# Patient Record
Sex: Female | Born: 1935 | Hispanic: No | Smoking: Former smoker
Health system: Southern US, Community
[De-identification: ages and names within clinical notes are randomized; demographics above are authoritative.]

## PROBLEM LIST (undated history)

## (undated) DIAGNOSIS — S82102A Unspecified fracture of upper end of left tibia, initial encounter for closed fracture: Secondary | ICD-10-CM

## (undated) DIAGNOSIS — F0390 Unspecified dementia without behavioral disturbance: Secondary | ICD-10-CM

## (undated) DIAGNOSIS — G20 Parkinson's disease: Secondary | ICD-10-CM

## (undated) DIAGNOSIS — F419 Anxiety disorder, unspecified: Secondary | ICD-10-CM

## (undated) DIAGNOSIS — M1990 Unspecified osteoarthritis, unspecified site: Secondary | ICD-10-CM

## (undated) DIAGNOSIS — G20A1 Parkinson's disease without dyskinesia, without mention of fluctuations: Secondary | ICD-10-CM

## (undated) DIAGNOSIS — F329 Major depressive disorder, single episode, unspecified: Secondary | ICD-10-CM

## (undated) DIAGNOSIS — F32A Depression, unspecified: Secondary | ICD-10-CM

## (undated) HISTORY — DX: Unspecified osteoarthritis, unspecified site: M19.90

## (undated) HISTORY — DX: Parkinson's disease: G20

## (undated) HISTORY — PX: TONSILLECTOMY: SUR1361

## (undated) HISTORY — DX: Depression, unspecified: F32.A

## (undated) HISTORY — DX: Major depressive disorder, single episode, unspecified: F32.9

## (undated) HISTORY — DX: Parkinson's disease without dyskinesia, without mention of fluctuations: G20.A1

## (undated) HISTORY — PX: APPENDECTOMY: SHX54

## (undated) HISTORY — PX: COLONOSCOPY W/ POLYPECTOMY: SHX1380

## (undated) HISTORY — DX: Anxiety disorder, unspecified: F41.9

## (undated) HISTORY — DX: Unspecified dementia, unspecified severity, without behavioral disturbance, psychotic disturbance, mood disturbance, and anxiety: F03.90

---

## 2003-03-31 ENCOUNTER — Encounter: Payer: Self-pay | Admitting: Internal Medicine

## 2006-04-01 ENCOUNTER — Encounter: Payer: Self-pay | Admitting: Internal Medicine

## 2006-10-26 ENCOUNTER — Ambulatory Visit: Payer: Self-pay | Admitting: Internal Medicine

## 2006-10-26 DIAGNOSIS — M810 Age-related osteoporosis without current pathological fracture: Secondary | ICD-10-CM

## 2006-10-26 DIAGNOSIS — M1990 Unspecified osteoarthritis, unspecified site: Secondary | ICD-10-CM | POA: Insufficient documentation

## 2006-10-26 DIAGNOSIS — F411 Generalized anxiety disorder: Secondary | ICD-10-CM | POA: Insufficient documentation

## 2006-10-26 DIAGNOSIS — R32 Unspecified urinary incontinence: Secondary | ICD-10-CM

## 2006-10-26 DIAGNOSIS — F329 Major depressive disorder, single episode, unspecified: Secondary | ICD-10-CM

## 2006-10-26 DIAGNOSIS — F068 Other specified mental disorders due to known physiological condition: Secondary | ICD-10-CM

## 2006-10-26 DIAGNOSIS — G20 Parkinson's disease: Secondary | ICD-10-CM

## 2007-01-01 ENCOUNTER — Ambulatory Visit: Payer: Self-pay | Admitting: *Deleted

## 2007-01-06 ENCOUNTER — Telehealth (INDEPENDENT_AMBULATORY_CARE_PROVIDER_SITE_OTHER): Payer: Self-pay | Admitting: *Deleted

## 2007-01-08 ENCOUNTER — Encounter (INDEPENDENT_AMBULATORY_CARE_PROVIDER_SITE_OTHER): Payer: Self-pay | Admitting: *Deleted

## 2007-01-26 ENCOUNTER — Encounter (INDEPENDENT_AMBULATORY_CARE_PROVIDER_SITE_OTHER): Payer: Self-pay | Admitting: *Deleted

## 2007-02-01 ENCOUNTER — Ambulatory Visit: Payer: Self-pay | Admitting: *Deleted

## 2007-02-24 ENCOUNTER — Ambulatory Visit: Payer: Self-pay | Admitting: *Deleted

## 2007-02-26 ENCOUNTER — Ambulatory Visit: Payer: Self-pay | Admitting: *Deleted

## 2007-05-10 ENCOUNTER — Ambulatory Visit: Payer: Self-pay | Admitting: *Deleted

## 2007-06-03 ENCOUNTER — Encounter: Payer: Self-pay | Admitting: Internal Medicine

## 2007-07-15 ENCOUNTER — Encounter: Payer: Self-pay | Admitting: Internal Medicine

## 2007-07-15 ENCOUNTER — Ambulatory Visit: Payer: Self-pay | Admitting: Internal Medicine

## 2007-09-09 ENCOUNTER — Ambulatory Visit: Payer: Self-pay | Admitting: Internal Medicine

## 2007-09-09 ENCOUNTER — Encounter: Payer: Self-pay | Admitting: Internal Medicine

## 2007-11-04 ENCOUNTER — Encounter: Payer: Self-pay | Admitting: Internal Medicine

## 2007-11-04 ENCOUNTER — Ambulatory Visit: Payer: Self-pay | Admitting: Internal Medicine

## 2008-02-22 ENCOUNTER — Ambulatory Visit: Payer: Self-pay | Admitting: Internal Medicine

## 2008-02-22 ENCOUNTER — Encounter: Payer: Self-pay | Admitting: Internal Medicine

## 2008-06-08 ENCOUNTER — Encounter: Payer: Self-pay | Admitting: Internal Medicine

## 2008-06-08 ENCOUNTER — Ambulatory Visit: Payer: Self-pay | Admitting: Internal Medicine

## 2008-06-26 ENCOUNTER — Encounter: Payer: Self-pay | Admitting: Internal Medicine

## 2008-09-07 ENCOUNTER — Encounter: Payer: Self-pay | Admitting: Internal Medicine

## 2008-09-07 ENCOUNTER — Ambulatory Visit: Payer: Self-pay | Admitting: Internal Medicine

## 2008-11-16 ENCOUNTER — Ambulatory Visit: Payer: Self-pay | Admitting: Internal Medicine

## 2008-11-16 ENCOUNTER — Encounter: Payer: Self-pay | Admitting: Internal Medicine

## 2008-11-16 DIAGNOSIS — J069 Acute upper respiratory infection, unspecified: Secondary | ICD-10-CM | POA: Insufficient documentation

## 2009-04-05 ENCOUNTER — Encounter: Payer: Self-pay | Admitting: Internal Medicine

## 2009-04-05 ENCOUNTER — Ambulatory Visit: Payer: Self-pay | Admitting: Internal Medicine

## 2009-04-09 ENCOUNTER — Telehealth: Payer: Self-pay | Admitting: Internal Medicine

## 2009-04-10 ENCOUNTER — Telehealth: Payer: Self-pay | Admitting: Internal Medicine

## 2009-04-11 ENCOUNTER — Encounter: Payer: Self-pay | Admitting: Internal Medicine

## 2009-05-23 ENCOUNTER — Telehealth: Payer: Self-pay | Admitting: Internal Medicine

## 2009-07-19 ENCOUNTER — Encounter: Payer: Self-pay | Admitting: Internal Medicine

## 2009-07-19 ENCOUNTER — Ambulatory Visit: Payer: Self-pay | Admitting: Internal Medicine

## 2009-08-01 ENCOUNTER — Telehealth: Payer: Self-pay | Admitting: Family Medicine

## 2009-08-06 ENCOUNTER — Telehealth: Payer: Self-pay | Admitting: Internal Medicine

## 2009-10-31 ENCOUNTER — Ambulatory Visit: Payer: Self-pay | Admitting: Family Medicine

## 2009-10-31 ENCOUNTER — Encounter: Payer: Self-pay | Admitting: Family Medicine

## 2009-10-31 DIAGNOSIS — H6120 Impacted cerumen, unspecified ear: Secondary | ICD-10-CM | POA: Insufficient documentation

## 2009-12-04 ENCOUNTER — Telehealth: Payer: Self-pay | Admitting: Internal Medicine

## 2009-12-31 ENCOUNTER — Telehealth: Payer: Self-pay | Admitting: Internal Medicine

## 2010-02-14 ENCOUNTER — Encounter: Payer: Self-pay | Admitting: Internal Medicine

## 2010-02-14 DIAGNOSIS — M1990 Unspecified osteoarthritis, unspecified site: Secondary | ICD-10-CM

## 2010-02-14 DIAGNOSIS — F329 Major depressive disorder, single episode, unspecified: Secondary | ICD-10-CM

## 2010-02-14 DIAGNOSIS — G20 Parkinson's disease: Secondary | ICD-10-CM

## 2010-02-14 DIAGNOSIS — F068 Other specified mental disorders due to known physiological condition: Secondary | ICD-10-CM

## 2010-02-19 NOTE — Miscellaneous (Signed)
Summary: Patient Care Update/[facility]  Patient Care Update/[facility]   Imported By: Maryln Gottron 04/17/2009 09:53:35  _____________________________________________________________________  External Attachment:    Type:   Image     Comment:   External Document

## 2010-02-19 NOTE — Assessment & Plan Note (Signed)
Summary: [facility]   Vital Signs:  Patient profile:   77 year old female Weight:      159 pounds Temp:     97.4 degrees F Pulse rate:   78 / minute Resp:     18 per minute BP sitting:   124 / 70 CC: Assisted living follow up   History of Present Illness: Reviewed status with Albin Felling RN and Bonita Quin -patient care coordinator doing fine  She notes mild backache in past few days no fall or apparent injury doing okay without meds so far  No changes in Parkinsons Remains completely independent with ADLs Walks stably  with rollator---can do without it in apt  No real change in memory problems she doesn't remember if she had any response to aricept when it was first started  Goes down for meals Stays to herself for the most part  Not depressed Enjoys reading Occ watches TV--some trouble with all the channels on cable now  Allergies: 1)  ! Codeine  Past History:  Past medical, surgical, family and social histories (including risk factors) reviewed for relevance to current acute and chronic problems.  Past Medical History: Reviewed history from 10/26/2006 and no changes required. Dementia Parkinson's disease Anxiety Depression Osteoarthritis Osteoporosis Urinary incontinence  Past Surgical History: Reviewed history from 10/26/2006 and no changes required. Appendectomy Tonsillectomy Colonoscopy with polyps--25 years ago or more  Family History: Reviewed history from 10/26/2006 and no changes required. Dad died of heart condition @70 's Mom died of old age in 75's 6 sisters all lived into 68's or thereabout No HTN or DM No breast or colon cancer  Social History: Reviewed history from 09/09/2007 and no changes required. Moved from [place]--was in assisted living in [facility] then Divorced (now widowed0 3 children--oldest son Jonny Ruiz lives locally Homemaker Former Smoker--very brief, few Alcohol use--none in many years Loves reading  Has an advanced  directive Requests DNR Would accept hospitalization Would not want G-tube Requests son Jonny Ruiz to be health care POA  Review of Systems       appetite is fine weight is up 8#---will watch sleeps well  Physical Exam  General:  alert and normal appearance.   Neck:  supple, no masses, no thyromegaly, and no cervical lymphadenopathy.   Lungs:  normal respiratory effort and normal breath sounds.   Heart:  normal rate, regular rhythm, and no gallop.   Gr 3/6 aortic systolic murmur Abdomen:  soft and non-tender.   Msk:  no joint swelling and no joint warmth.   No spine tenderness Extremities:  no sig edema Neurologic:  Normal tone slight resting tremor in hands Mild bradykinesia Skin:  no suspicious lesions and no ulcerations.   Psych:  normally interactive, good eye contact, not anxious appearing, and not depressed appearing.     Impression & Recommendations:  Problem # 1:  PARKINSON'S DISEASE (ICD-332.0) Assessment Unchanged stable functional status No changes needed  Problem # 2:  DEMENTIA (ICD-294.8) Assessment: Unchanged mild and non progressive unlikely that the aricept is doing anything for her will stop and monitor her cognitive status  Problem # 3:  OSTEOARTHRITIS (ICD-715.90) Assessment: Unchanged mild  backache now does okay without meds  Problem # 4:  URINARY INCONTINENCE (ICD-788.30) Assessment: Unchanged satisfied with the med  Problem # 5:  DEPRESSION (ICD-311) Assessment: Unchanged mood and sleep are fine on current regimen  Her updated medication list for this problem includes:    Alprazolam 0.25 Mg Tabs (Alprazolam) .Marland Kitchen... Take 1 tablet by  mouth at bedtime    Nortriptyline Hcl 50 Mg Caps (Nortriptyline hcl) .Marland Kitchen... Take 1 capsule by mouth at bedtime  Complete Medication List: 1)  Alprazolam 0.25 Mg Tabs (Alprazolam) .... Take 1 tablet by mouth at bedtime 2)  Adult Aspirin Low Strength 81 Mg Chew (Aspirin) .... Take 1 tablet by mouth once a day 3)   Carbidopa-levodopa Cr 25-100 Mg Tbcr (Carbidopa-levodopa) .Marland Kitchen.. 1 four times daily 4)  Centrum Silver Tabs (Multiple vitamins-minerals) .... Take 1 tablet by mouth once a day 5)  Detrol La 2 Mg Cp24 (Tolterodine tartrate) .... Take 1 tablet by mouth once a day 6)  Mirapex 0.25 Mg Tabs (Pramipexole dihydrochloride) .... Take 1 tablet by mouth three times a day 7)  Nortriptyline Hcl 50 Mg Caps (Nortriptyline hcl) .... Take 1 capsule by mouth at bedtime 8)  Occuvite With Lutein  .... 2 bid 9)  Primidone 50 Mg Tabs (Primidone) .... 1/2 at bedtime 10)  Viactiv Multi-vitamin Chew (Multiple vitamins-calcium) .Marland Kitchen.. 1 three times a day 11)  Vitamin D (ergocalciferol) 50000 Unit Caps (Ergocalciferol) .Marland Kitchen.. 1 cap monthly  Patient Instructions: 1)  Will plan follow up visit in 3-4 months

## 2010-02-19 NOTE — Progress Notes (Signed)
Summary: refill request for mirapex  Phone Note Refill Request Message from:  Fax from Pharmacy  Refills Requested: Medication #1:  MIRAPEX 0.25 MG  TABS Take 1 tablet by mouth three times a day   Last Refilled: 07/03/2009 Faxed request from pharmacare, phone [phone].  Initial call taken by: Lowella Petties CMA,  August 01, 2009 9:49 AM  Follow-up for Phone Call        Rx faxed to pharmacy Follow-up by: Mervin Hack CMA Duncan Dull),  August 01, 2009 9:54 AM    Prescriptions: MIRAPEX 0.25 MG  TABS (PRAMIPEXOLE DIHYDROCHLORIDE) Take 1 tablet by mouth three times a day  #90 x 3   Entered by:   Mervin Hack CMA (AAMA)   Authorized by:   Ruthe Mannan MD   Signed by:   Mervin Hack CMA (AAMA) on 08/01/2009   Method used:   Faxed to ...       [facility] (mail-order)       [address]       [place], [place]  [address]       Ph: [phone]       Fax: [phone]   RxID:   [id number] MIRAPEX 0.25 MG  TABS (PRAMIPEXOLE DIHYDROCHLORIDE) Take 1 tablet by mouth three times a day  #90 x 3   Entered and Authorized by:   Ruthe Mannan MD   Signed by:   Ruthe Mannan MD on 08/01/2009   Method used:   Telephoned to ...         RxID:   [id number]

## 2010-02-19 NOTE — Assessment & Plan Note (Signed)
Summary: [facility]   Vital Signs:  Patient profile:   77 year old female Weight:      157 pounds Temp:     97.9 degrees F Pulse rate:   78 / minute Resp:     20 per minute BP sitting:   112 / 60 CC: Assisted living follow up   History of Present Illness: Doing well No new concerns from Chiropractor or resident assistant  She has no new concerns Parkinson's is stable no increased stiffness Still independent in personal care  Remains in room most of the time she prefers it that way not depressed---reads, rarely watches, TV but remains satisfied Goes down to meals and still enjoys this  No trouble with incontinence not particularly troubled with frequency wants to try off the detrol  sleeps fine Awakens fairly refreshed  Allergies: 1)  ! Codeine  Past History:  Past medical, surgical, family and social histories (including risk factors) reviewed for relevance to current acute and chronic problems.  Past Medical History: Reviewed history from 10/26/2006 and no changes required. Dementia Parkinson's disease Anxiety Depression Osteoarthritis Osteoporosis Urinary incontinence  Past Surgical History: Reviewed history from 10/26/2006 and no changes required. Appendectomy Tonsillectomy Colonoscopy with polyps--25 years ago or more  Family History: Reviewed history from 10/26/2006 and no changes required. Dad died of heart condition @70 's Mom died of old age in 60's 6 sisters all lived into 49's or thereabout No HTN or DM No breast or colon cancer  Social History: Reviewed history from 09/09/2007 and no changes required. Moved from [place]--was in assisted living in [facility] then Divorced (now widowed0 3 children--oldest son Jonny Ruiz lives locally Homemaker Former Smoker--very brief, few Alcohol use--none in many years Loves reading  Has an advanced directive Requests DNR Would accept hospitalization Would not want G-tube Requests son Jonny Ruiz to  be health care POA  Review of Systems       eating well weight is fairly stable bowels are okay  Physical Exam  General:  alert and normal appearance.   Neck:  supple, no masses, no thyromegaly, no carotid bruits, and no cervical lymphadenopathy.   Lungs:  normal respiratory effort and normal breath sounds.   Heart:  normal rate, regular rhythm, no murmur, and no gallop.   Abdomen:  soft and non-tender.   Extremities:  thick calves without pitting edema Neurologic:  Slight hand tremor at rest walks stably with rollator fairly normal tone and no sig bradykinesia Psych:  normally interactive, good eye contact, not anxious appearing, and not depressed appearing.     Impression & Recommendations:  Problem # 1:  PARKINSON'S DISEASE (ICD-332.0) Assessment Comment Only stable functional status no changes needed  Problem # 2:  URINARY INCONTINENCE (ICD-788.30) Assessment: Improved has done well  will have trial off the detrol  Problem # 3:  OSTEOPOROSIS (ICD-733.00) Assessment: Unchanged on vitamin D still walks regularly good safety awareness and always uses rollator  Her updated medication list for this problem includes:    Vitamin D (ergocalciferol) 50000 Unit Caps (Ergocalciferol) .Marland Kitchen... 1 cap monthly  Problem # 4:  DEPRESSION (ICD-311) Assessment: Unchanged mood seems fine on this regimen mostly a loner but truly seems satisfied with that  Her updated medication list for this problem includes:    Alprazolam 0.25 Mg Tabs (Alprazolam) .Marland Kitchen... Take 1 tablet once  a day and two times a day as needed for nerves    Nortriptyline Hcl 50 Mg Caps (Nortriptyline hcl) .Marland Kitchen... Take 1 capsule  by mouth at bedtime  Problem # 5:  DEMENTIA (ICD-294.8) Assessment: Comment Only mild and non progressive  Complete Medication List: 1)  Alprazolam 0.25 Mg Tabs (Alprazolam) .... Take 1 tablet once  a day and two times a day as needed for nerves 2)  Adult Aspirin Low Strength 81 Mg Chew  (Aspirin) .... Take 1 tablet by mouth once a day 3)  Carbidopa-levodopa Cr 25-100 Mg Tbcr (Carbidopa-levodopa) .Marland Kitchen.. 1 four times daily 4)  Centrum Silver Tabs (Multiple vitamins-minerals) .... Take 1 tablet by mouth once a day 5)  Mirapex 0.25 Mg Tabs (Pramipexole dihydrochloride) .... Take 1 tablet by mouth three times a day 6)  Nortriptyline Hcl 50 Mg Caps (Nortriptyline hcl) .... Take 1 capsule by mouth at bedtime 7)  Occuvite With Lutein  .... 2 bid 8)  Primidone 50 Mg Tabs (Primidone) .... 1/2 at bedtime 9)  Viactiv Multi-vitamin Chew (Multiple vitamins-calcium) .Marland Kitchen.. 1 three times a day 10)  Vitamin D (ergocalciferol) 50000 Unit Caps (Ergocalciferol) .Marland Kitchen.. 1 cap monthly  Patient Instructions: 1)  Will plan follow up in 2-3 months

## 2010-02-19 NOTE — Progress Notes (Signed)
Summary: refil request for pramipexole  Phone Note Refill Request Message from:  Fax from Pharmacy  Refills Requested: Medication #1:  pramipexole 0.25   Last Refilled: 10/29/2009 Faxed form from pharmacare is on your desk   Initial call taken by: Lowella Petties CMA, AAMA,  December 04, 2009 1:13 PM  Follow-up for Phone Call        Rx faxed to pharmacy Follow-up by: Cindee Salt MD,  December 04, 2009 1:44 PM    Prescriptions: MIRAPEX 0.25 MG  TABS (PRAMIPEXOLE DIHYDROCHLORIDE) Take 1 tablet by mouth three times a day  #90 x 11   Entered and Authorized by:   Cindee Salt MD   Signed by:   Cindee Salt MD on 12/04/2009   Method used:   Faxed to ...       [facility] (mail-order)       [address]       [place], [place]  [address]       Ph: [phone]       Fax: [phone]   RxID:   [id number]

## 2010-02-19 NOTE — Progress Notes (Signed)
  Phone Note Other Incoming   Caller: call a nurse Summary of Call: callled from [facility] more anxious asking to give extra xanax dose generally just gets it at night  okay to change to two times a day as needed and at bedtime as needed  Initial call taken by: Cindee Salt MD,  April 09, 2009 5:21 PM    New/Updated Medications: ALPRAZOLAM 0.25 MG  TABS (ALPRAZOLAM) Take 1 tablet two times a day and at bedtime as needed for nerves

## 2010-02-19 NOTE — Assessment & Plan Note (Signed)
Summary: [facility] Visit   Vital Signs:  Patient profile:   77 year old female Height:      59.5 inches Weight:      157 pounds Pulse rate:   82 / minute Resp:     18 per minute BP sitting:   120 / 60  History of Present Illness: 76 yo with right ear pain x 2 weeks.  2 weeks ago, felt like ear was "stopped up."  Has had decreased hearing, then became painful.  No drainage from ear.  No runny nose, cough, fever, chills or other URI symptoms. Otherwise doing quite well.  Current Medications (verified): 1)  Alprazolam 0.25 Mg  Tabs (Alprazolam) .... Take 1 Tablet Once  A Day and Two Times A Day As Needed For Nerves 2)  Adult Aspirin Low Strength 81 Mg  Chew (Aspirin) .... Take 1 Tablet By Mouth Once A Day 3)  Carbidopa-Levodopa Cr 25-100 Mg  Tbcr (Carbidopa-Levodopa) .Marland Kitchen.. 1 Four Times Daily 4)  Centrum Silver   Tabs (Multiple Vitamins-Minerals) .... Take 1 Tablet By Mouth Once A Day 5)  Mirapex 0.25 Mg  Tabs (Pramipexole Dihydrochloride) .... Take 1 Tablet By Mouth Three Times A Day 6)  Nortriptyline Hcl 50 Mg  Caps (Nortriptyline Hcl) .... Take 1 Capsule By Mouth At Bedtime 7)  Occuvite With Lutein .... 2 Bid 8)  Primidone 50 Mg  Tabs (Primidone) .... 1/2 At Bedtime 9)  Viactiv Multi-Vitamin   Chew (Multiple Vitamins-Calcium) .Marland Kitchen.. 1 Three Times A Day 10)  Vitamin D (Ergocalciferol) 50000 Unit Caps (Ergocalciferol) .Marland Kitchen.. 1 Cap Monthly  Allergies (verified): 1)  ! Codeine  Past History:  Past Medical History: Last updated: 2006-11-18 Dementia Parkinson's disease Anxiety Depression Osteoarthritis Osteoporosis Urinary incontinence  Past Surgical History: Last updated: 11/18/2006 Appendectomy Tonsillectomy Colonoscopy with polyps--25 years ago or more  Family History: Last updated: 18-Nov-2006 Dad died of heart condition @70 's Mom died of old age in 55's 6 sisters all lived into 70's or thereabout No HTN or DM No breast or colon cancer  Social  History: Last updated: 09/09/2007 Moved from [place] in assisted living in [place] then Divorced (now widowed0 3 children--oldest son Jonny Ruiz lives locally Homemaker Former Smoker--very brief, few Alcohol use--none in many years Loves reading  Has an advanced directive Requests DNR Would accept hospitalization Would not want G-tube Requests son Jonny Ruiz to be health care POA  Risk Factors: Smoking Status: quit (2006/11/18)  Review of Systems      See HPI General:  Denies fever. ENT:  Complains of decreased hearing; denies nasal congestion, postnasal drainage, sinus pressure, and sore throat. Resp:  Denies cough.  Physical Exam  General:  alert and normal appearance.   Ears:  right ear- impacted cerumen, unable to visualize TM. Left TM clear Nose:  mild congestion Mouth:  no erythema and no exudates.   Lungs:  normal respiratory effort and normal breath sounds.   Heart:  normal rate, regular rhythm, no murmur, and no gallop.   Psych:  normally interactive, good eye contact, not anxious appearing, and not depressed appearing.     Impression & Recommendations:  Problem # 1:  CERUMEN IMPACTION, RIGHT (ICD-380.4) Assessment New Discussed with Carla.  Will use Debrox solution.  If we cannot remove cerumen this way, she will let me know that I will bring a curette from our office.  Complete Medication List: 1)  Alprazolam 0.25 Mg Tabs (Alprazolam) .... Take 1 tablet once  a day and two times  a day as needed for nerves 2)  Adult Aspirin Low Strength 81 Mg Chew (Aspirin) .... Take 1 tablet by mouth once a day 3)  Carbidopa-levodopa Cr 25-100 Mg Tbcr (Carbidopa-levodopa) .Marland Kitchen.. 1 four times daily 4)  Centrum Silver Tabs (Multiple vitamins-minerals) .... Take 1 tablet by mouth once a day 5)  Mirapex 0.25 Mg Tabs (Pramipexole dihydrochloride) .... Take 1 tablet by mouth three times a day 6)  Nortriptyline Hcl 50 Mg Caps (Nortriptyline hcl) .... Take 1 capsule by mouth at bedtime 7)   Occuvite With Lutein  .... 2 bid 8)  Primidone 50 Mg Tabs (Primidone) .... 1/2 at bedtime 9)  Viactiv Multi-vitamin Chew (Multiple vitamins-calcium) .Marland Kitchen.. 1 three times a day 10)  Vitamin D (ergocalciferol) 50000 Unit Caps (Ergocalciferol) .Marland Kitchen.. 1 cap monthly  Appended Document: [facility] Lowella Bandy,  Please print out this note and give it to [place] to fax to [employer]. Thanks! Tevion Laforge  Appended Document: [facility] Visit done

## 2010-02-19 NOTE — Progress Notes (Signed)
Summary: refill request for alprazolam  Phone Note Refill Request Message from:  Fax from Pharmacy  Refills Requested: Medication #1:  ALPRAZOLAM 0.25 MG  TABS Take 1 tablet two times a day and at bedtime as needed for nerves   Last Refilled: 03/22/2009 Faxed request from pharmacare is on your desk.  Initial call taken by: Lowella Petties CMA,  May 23, 2009 12:37 PM  Follow-up for Phone Call        okay #60 x 3 Follow-up by: Cindee Salt MD,  May 23, 2009 1:25 PM  Additional Follow-up for Phone Call Additional follow up Details #1::        Rx faxed to pharmacy Additional Follow-up by: DeShannon Smith CMA Duncan Dull),  May 23, 2009 4:01 PM    Prescriptions: ALPRAZOLAM 0.25 MG  TABS (ALPRAZOLAM) Take 1 tablet two times a day and at bedtime as needed for nerves  #60 x 3   Entered by:   Mervin Hack CMA (AAMA)   Authorized by:   Cindee Salt MD   Signed by:   Mervin Hack CMA (AAMA) on 05/23/2009   Method used:   Handwritten   RxID:   [id number]

## 2010-02-19 NOTE — Progress Notes (Signed)
Summary: call a nurse   Phone Note Call from Patient   Caller: Nurse from [facility]  Summary of Call: Triage Record Num: [id number] Operator: Tomasita Crumble Patient Name: Rachel Faulkner Call Date & Time: 04/09/2009 5:08:22PM Patient Phone: [phone] PCP: Tillman Abide Patient Gender: Female PCP Fax : Patient DOB: 01-22-1914 Practice Name: [facility] Reason for Call: York Cerise, RN calling from [facility]. Reports pt.feels more anxious than usual and would like extra dose of Xanax. Dr. Alphonsus Sias on call; paged per caller request. He ordered she may have extra dose Xanax 0.25 mg now and for facility to change to 1 tablet BID and at bedtime prn for nerves. Caller informed of same. Protocol(s) Used: Anxiety Recommended Outcome per Protocol: Call Provider within 24 Hours Reason for Outcome: New or increasing symptoms AND taking medications/following therapy as prescribed Care Advice: Continue to follow your treatment plan. Take all medications as prescribed and keep all appointments with your provider.  ~  ~ Eat a balanced diet and follow a regular sleep schedule with adequate sleep, about 7 to 8 hours a night. 04/09/2009 5:23:12PM Page 1 of 1 CAN Initial call taken by: Melody Comas,  April 10, 2009 9:41 AM

## 2010-02-19 NOTE — Progress Notes (Signed)
Summary: refill request for carbidopa  Phone Note Refill Request Message from:  Fax from Pharmacy  Refills Requested: Medication #1:  CARBIDOPA-LEVODOPA CR 25-100 MG  TBCR 1 four times daily   Last Refilled: 07/03/2009 Faxed request from pharmacare is on your desk.  Initial call taken by: Lowella Petties CMA,  August 06, 2009 8:07 AM  Follow-up for Phone Call        1 year rx approved Follow-up by: Cindee Salt MD,  August 06, 2009 3:24 PM    Prescriptions: CARBIDOPA-LEVODOPA CR 25-100 MG  TBCR (CARBIDOPA-LEVODOPA) 1 four times daily  #120 x 12   Entered by:   Lowella Petties CMA   Authorized by:   Cindee Salt MD   Signed by:   Lowella Petties CMA on 08/06/2009   Method used:   Faxed to ...       [facility] (mail-order)       [address]       [place], [place]  [address]       Ph: [phone]       Fax: [phone]   RxID:   [id number]

## 2010-02-21 NOTE — Progress Notes (Signed)
Summary: Rx Alprazolam  Phone Note Refill Request Call back at [phone] Message from:  [facility] on December 31, 2009 11:17 AM  Refills Requested: Medication #1:  ALPRAZOLAM 0.25 MG  TABS Take 1 tablet once  a day and two times a day as needed for nerves   Last Refilled: 11/09/2009  Method Requested: Telephone to Pharmacy Initial call taken by: Sydell Axon LPN,  December 31, 2009 11:18 AM  Follow-up for Phone Call        okay #60 x 5 Follow-up by: Cindee Salt MD,  December 31, 2009 1:14 PM  Additional Follow-up for Phone Call Additional follow up Details #1::        Rx faxed to pharmacy Additional Follow-up by: DeShannon Katrinka Blazing CMA Duncan Dull),  December 31, 2009 2:18 PM    Prescriptions: ALPRAZOLAM 0.25 MG  TABS (ALPRAZOLAM) Take 1 tablet once  a day and two times a day as needed for nerves  #60 x 5   Entered by:   Mervin Hack CMA (AAMA)   Authorized by:   Cindee Salt MD   Signed by:   Mervin Hack CMA (AAMA) on 12/31/2009   Method used:   Handwritten   RxID:   [id number]

## 2010-02-21 NOTE — Assessment & Plan Note (Signed)
Summary: [facility]   Vital Signs:  Patient profile:   77 year old female Weight:      155 pounds Temp:     97.5 degrees F Pulse rate:   70 / minute Resp:     16 per minute BP sitting:   120 / 80  History of Present Illness: doing fairly well Reviewed status with Albin Felling, RN--no new concerns  Parkinson's seems stable walks with rollator fine No falls Still independent with ADLs---occ gets assist with bathing  Feels her mood is stable no overt depression Goes downstairs for meals but otherwise still prefers her privacy  No major arthritis pain Hasn't needed meds  She still notes some memory problems No progression per staff No change in functional status  Allergies: 1)  ! Codeine  Past History:  Past medical, surgical, family and social histories (including risk factors) reviewed for relevance to current acute and chronic problems.  Past Medical History: Reviewed history from 10/26/2006 and no changes required. Dementia Parkinson's disease Anxiety Depression Osteoarthritis Osteoporosis Urinary incontinence  Past Surgical History: Reviewed history from 10/26/2006 and no changes required. Appendectomy Tonsillectomy Colonoscopy with polyps--25 years ago or more  Family History: Reviewed history from 10/26/2006 and no changes required. Dad died of heart condition @70 's Mom died of old age in 8's 6 sisters all lived into 32's or thereabout No HTN or DM No breast or colon cancer  Social History: Reviewed history from 09/09/2007 and no changes required. Moved from [place]--was in assisted living in [facility] then Divorced (now widowed0 3 children--oldest son Jonny Ruiz lives locally Homemaker Former Smoker--very brief, few Alcohol use--none in many years Loves reading  Has an advanced directive Requests DNR Would accept hospitalization Would not want G-tube Requests son Jonny Ruiz to be health care POA  Review of Systems       sleeps  well Appeitte is fine Weight stable bowels are fine--but occ constipation. Rarely needs meds though--she can adjust diet  Physical Exam  General:  alert and normal appearance.   Neck:  supple, no masses, and no cervical lymphadenopathy.   Lungs:  normal respiratory effort, no intercostal retractions, no accessory muscle use, and normal breath sounds.   Heart:  normal rate, regular rhythm, and no gallop.   Gr 3/6 aortic systolic murmur heard in apex also Abdomen:  soft and non-tender.   Msk:  no joint tenderness and no joint swelling.   Extremities:  No edema Neurologic:  Mild resting tremor in hands No sig bradykinesia Fairly normal tone Psych:  normally interactive, good eye contact, not anxious appearing, and not depressed appearing.     Impression & Recommendations:  Problem # 1:  PARKINSON'S DISEASE (ICD-332.0) Assessment Unchanged really seems mild without progression No functional changes  continue current Rx  Problem # 2:  DEMENTIA (ICD-294.8) Assessment: Unchanged mild without sig progression May be related to Parkinsons  Problem # 3:  DEPRESSION (ICD-311) Assessment: Unchanged Mood is stable Still a bit of a loner--but no change  Her updated medication list for this problem includes:    Alprazolam 0.25 Mg Tabs (Alprazolam) .Marland Kitchen... Take 1 tablet once  a day and two times a day as needed for nerves    Nortriptyline Hcl 50 Mg Caps (Nortriptyline hcl) .Marland Kitchen... Take 1 capsule by mouth at bedtime  Problem # 4:  OSTEOARTHRITIS (ICD-715.90) Assessment: Unchanged mild and hasn't needed meds  Complete Medication List: 1)  Alprazolam 0.25 Mg Tabs (Alprazolam) .... Take 1 tablet once  a day and  two times a day as needed for nerves 2)  Adult Aspirin Low Strength 81 Mg Chew (Aspirin) .... Take 1 tablet by mouth once a day 3)  Carbidopa-levodopa Cr 25-100 Mg Tbcr (Carbidopa-levodopa) .Marland Kitchen.. 1 four times daily 4)  Centrum Silver Tabs (Multiple vitamins-minerals) .... Take 1  tablet by mouth once a day 5)  Mirapex 0.25 Mg Tabs (Pramipexole dihydrochloride) .... Take 1 tablet by mouth three times a day 6)  Nortriptyline Hcl 50 Mg Caps (Nortriptyline hcl) .... Take 1 capsule by mouth at bedtime 7)  Occuvite With Lutein  .... 2 bid 8)  Primidone 50 Mg Tabs (Primidone) .... 1/2 at bedtime 9)  Viactiv Multi-vitamin Chew (Multiple vitamins-calcium) .Marland Kitchen.. 1 three times a day 10)  Vitamin D (ergocalciferol) 50000 Unit Caps (Ergocalciferol) .Marland Kitchen.. 1 cap monthly  Patient Instructions: 1)  Will plan visit in about 2-3 months

## 2010-03-13 ENCOUNTER — Encounter: Payer: Self-pay | Admitting: Internal Medicine

## 2010-03-28 NOTE — Miscellaneous (Signed)
Summary: [facility]  [facility]ician[facility] Orders/[facility]   Imported By: Maryln Gottron 03/18/2010 15:57:13  _____________________________________________________________________  External Attachment:    Type:   Image     Comment:   External Document

## 2010-05-04 ENCOUNTER — Encounter: Payer: Self-pay | Admitting: Internal Medicine

## 2010-05-23 ENCOUNTER — Encounter: Payer: Self-pay | Admitting: Internal Medicine

## 2010-05-23 ENCOUNTER — Non-Acute Institutional Stay: Payer: Federal, State, Local not specified - PPO | Admitting: Internal Medicine

## 2010-05-23 VITALS — BP 110/60 | HR 66 | Temp 98.1°F | Resp 18 | Wt 154.0 lb

## 2010-05-23 DIAGNOSIS — F3289 Other specified depressive episodes: Secondary | ICD-10-CM

## 2010-05-23 DIAGNOSIS — G20 Parkinson's disease: Secondary | ICD-10-CM

## 2010-05-23 DIAGNOSIS — M810 Age-related osteoporosis without current pathological fracture: Secondary | ICD-10-CM

## 2010-05-23 DIAGNOSIS — G20A1 Parkinson's disease without dyskinesia, without mention of fluctuations: Secondary | ICD-10-CM

## 2010-05-23 DIAGNOSIS — F329 Major depressive disorder, single episode, unspecified: Secondary | ICD-10-CM

## 2010-05-23 DIAGNOSIS — F411 Generalized anxiety disorder: Secondary | ICD-10-CM

## 2010-05-23 DIAGNOSIS — R32 Unspecified urinary incontinence: Secondary | ICD-10-CM

## 2010-05-23 DIAGNOSIS — F068 Other specified mental disorders due to known physiological condition: Secondary | ICD-10-CM

## 2010-05-23 DIAGNOSIS — M1990 Unspecified osteoarthritis, unspecified site: Secondary | ICD-10-CM

## 2010-05-23 NOTE — Patient Instructions (Signed)
Will plan follow up in about 3 months 

## 2010-05-23 NOTE — Progress Notes (Signed)
Subjective:    Patient ID: Rachel Faulkner, female    DOB: 21-Mar-1914, 77 y.o.   MRN: [id number]  HPI No new concerns per Rachel Felling RN  She feels fine Continues on same Parkingson meds Able to walk with rollator Handles ADLs pretty well  No sig arthritis pain No meds needed  Mood is good No sig depressed mood Not really anxious---gets xanax at night  Voids okay No recent issues with incontinence No pads needed  Current outpatient prescriptions:ALPRAZolam (XANAX) 0.25 MG tablet, Take 0.25 mg by mouth daily. And twice a day as needed for nerves , Disp: , Rfl: ;  aspirin 81 MG tablet, Take 81 mg by mouth daily.  , Disp: , Rfl: ;  Calcium-Vitamin D-Vitamin K (VIACTIV) 500-100-40 MG-UNT-MCG CHEW, Chew 1 tablet by mouth daily.  , Disp: , Rfl: ;  carbidopa-levodopa (SINEMET) 25-100 MG per tablet, Take 1 tablet by mouth 4 (four) times daily.  , Disp: , Rfl:  ergocalciferol (VITAMIN D2) 50000 UNITS capsule, Take 50,000 Units by mouth every 30 (thirty) days.  , Disp: , Rfl: ;  Multiple Vitamins-Minerals (CENTRUM SILVER PO), Take 1 tablet by mouth daily.  , Disp: , Rfl: ;  multivitamin-lutein (OCUVITE-LUTEIN) CAPS, Take 2 capsules by mouth 2 (two) times daily.  , Disp: , Rfl: ;  nortriptyline (PAMELOR) 50 MG capsule, Take 50 mg by mouth at bedtime.  , Disp: , Rfl:  pramipexole (MIRAPEX) 0.25 MG tablet, Take 0.25 mg by mouth 3 (three) times daily.  , Disp: , Rfl: ;  primidone (MYSOLINE) 50 MG tablet, Take 25 mg by mouth at bedtime.  , Disp: , Rfl:   Past Medical History  Diagnosis Date  . Dementia without behavioral disturbance   . Parkinson disease   . Anxiety   . Depression   . Arthritis   . Osteoporosis   . Urinary incontinence     Past Surgical History  Procedure Date  . Appendectomy   . Tonsillectomy   . Colonoscopy w/ polypectomy ?1980's    Family History  Problem Relation Age of Onset  . Diabetes Neg Hx   . Hypertension Neg Hx   . Breast cancer Neg Hx   . Colon cancer Neg Hx       History   Social History  . Marital Status: Widowed    Spouse Name: N/A    Number of Children: 3  . Years of Education: N/A   Occupational History  . homemaker    Social History Main Topics  . Smoking status: Former Smoker -- 2 years  . Smokeless tobacco: Not on file  . Alcohol Use: No     did drink a little in distant past  . Drug Use: Not on file  . Sexually Active: Not on file   Other Topics Concern  . Not on file   Social History Narrative   Divorced then widowedMoved here from [place] thereLoves readingHad an advanced directiveRequested DNR and order doneWould accept hospitalizationWould not want feeding tubeRequests son Rachel Ruiz (who lives locally) to be health care POA   Review of Systems Appetite is fine Weight is stable Occ will go out for group dinner Enjoys music Mostly still prefers being in her room    Objective:   Physical Exam  Constitutional: She appears well-developed and well-nourished. No distress.  Neck: Normal range of motion. Neck supple. No thyromegaly present.  Cardiovascular: Normal rate and regular rhythm.  Exam reveals no gallop.   Murmur heard.  Soft systolic murmur  Pulmonary/Chest: Breath sounds normal. No respiratory distress. She has no wheezes. She has no rales.  Abdominal: Soft. There is no tenderness.  Musculoskeletal: Normal range of motion. She exhibits no edema and no tenderness.  Lymphadenopathy:    She has no cervical adenopathy.  Neurological: She exhibits normal muscle tone.       Very slight resting tremor in hands Minimal bradykinesia Walks fairly well with rollator  Psychiatric: She has a normal mood and affect. Her behavior is normal. Judgment and thought content normal.          Assessment & Plan:

## 2010-06-18 ENCOUNTER — Inpatient Hospital Stay: Payer: Self-pay | Admitting: Internal Medicine

## 2010-06-19 DIAGNOSIS — R55 Syncope and collapse: Secondary | ICD-10-CM

## 2010-06-24 ENCOUNTER — Ambulatory Visit: Payer: Federal, State, Local not specified - PPO | Admitting: Internal Medicine

## 2010-06-25 DIAGNOSIS — F068 Other specified mental disorders due to known physiological condition: Secondary | ICD-10-CM

## 2010-06-25 DIAGNOSIS — F411 Generalized anxiety disorder: Secondary | ICD-10-CM

## 2010-06-25 DIAGNOSIS — K260 Acute duodenal ulcer with hemorrhage: Secondary | ICD-10-CM

## 2010-08-08 ENCOUNTER — Encounter: Payer: Self-pay | Admitting: Internal Medicine

## 2010-08-08 ENCOUNTER — Non-Acute Institutional Stay: Payer: Federal, State, Local not specified - PPO | Admitting: Internal Medicine

## 2010-08-08 DIAGNOSIS — G20 Parkinson's disease: Secondary | ICD-10-CM

## 2010-08-08 DIAGNOSIS — F068 Other specified mental disorders due to known physiological condition: Secondary | ICD-10-CM

## 2010-08-08 DIAGNOSIS — G20A1 Parkinson's disease without dyskinesia, without mention of fluctuations: Secondary | ICD-10-CM

## 2010-08-08 DIAGNOSIS — F411 Generalized anxiety disorder: Secondary | ICD-10-CM

## 2010-08-08 DIAGNOSIS — M1990 Unspecified osteoarthritis, unspecified site: Secondary | ICD-10-CM

## 2010-08-08 NOTE — Assessment & Plan Note (Signed)
Mild and still well controlled with meds No signs of dyskinesias with Rx

## 2010-08-08 NOTE — Progress Notes (Signed)
Subjective:    Patient ID: Rachel Faulkner, female    DOB: 05-12-1914, 77 y.o.   MRN: [id number]  HPI Doing fairly well No new concerns from her  Still no problems in personal care Walks with rollator--even in apt Independent in personal care  Nerves have okay Hasn't needed alprazolam recently  She notes no changes in her memory Staff notice slow decline but no functional changes  No sig arthritis pain Doesn't think she uses the hydrocodone much  Current Outpatient Prescriptions on File Prior to Visit  Medication Sig Dispense Refill  . ALPRAZolam (XANAX) 0.25 MG tablet Take 0.25 mg by mouth daily. And twice a day as needed for nerves       . aspirin 81 MG tablet Take 81 mg by mouth daily.        . Calcium-Vitamin D-Vitamin K (VIACTIV) 500-100-40 MG-UNT-MCG CHEW Chew 1 tablet by mouth daily.        . carbidopa-levodopa (SINEMET) 25-100 MG per tablet Take 1 tablet by mouth 4 (four) times daily.        . ergocalciferol (VITAMIN D2) 50000 UNITS capsule Take 50,000 Units by mouth every 30 (thirty) days.        . Multiple Vitamins-Minerals (CENTRUM SILVER PO) Take 1 tablet by mouth daily.        . multivitamin-lutein (OCUVITE-LUTEIN) CAPS Take 2 capsules by mouth 2 (two) times daily.        . nortriptyline (PAMELOR) 50 MG capsule Take 50 mg by mouth at bedtime.        . pramipexole (MIRAPEX) 0.25 MG tablet Take 0.25 mg by mouth 3 (three) times daily.          Allergies  Allergen Reactions  . Codeine     Past Medical History  Diagnosis Date  . Dementia without behavioral disturbance   . Parkinson disease   . Anxiety   . Depression   . Arthritis   . Osteoporosis   . Urinary incontinence     Past Surgical History  Procedure Date  . Appendectomy   . Tonsillectomy   . Colonoscopy w/ polypectomy ?1980's    Family History  Problem Relation Age of Onset  . Diabetes Neg Hx   . Hypertension Neg Hx   . Breast cancer Neg Hx   . Colon cancer Neg Hx     History   Social  History  . Marital Status: Widowed    Spouse Name: N/A    Number of Children: 3  . Years of Education: N/A   Occupational History  . homemaker    Social History Main Topics  . Smoking status: Former Smoker -- 2 years  . Smokeless tobacco: Not on file  . Alcohol Use: No     did drink a little in distant past  . Drug Use: Not on file  . Sexually Active: Not on file   Other Topics Concern  . Not on file   Social History Narrative   Divorced then widowedMoved here from [place] --[facility] thereLoves readingHad an advanced directiveRequested DNR and order doneWould accept hospitalizationWould not want feeding tubeRequests son Rachel Ruiz (who lives locally) to be health care POA    Review of Systems Appetite is fine Weight stable Sleeps fine Bowels are fine     Objective:   Physical Exam  Constitutional: She appears well-developed and well-nourished. No distress.  Neck: Normal range of motion. Neck supple. No thyromegaly present.  Cardiovascular: Normal rate and regular rhythm.  Exam reveals no gallop.   Murmur heard.      Soft systolic murmur at base  Pulmonary/Chest: Effort normal and breath sounds normal. No respiratory distress. She has no wheezes. She has no rales.  Abdominal: Soft. There is no tenderness.  Musculoskeletal: She exhibits no edema and no tenderness.  Lymphadenopathy:    She has no cervical adenopathy.  Neurological:       Very mild finger to nose difficulty on left only No sig resting tremor Gait slow but steady  Psychiatric: She has a normal mood and affect. Her behavior is normal. Judgment and thought content normal.          Assessment & Plan:

## 2010-08-08 NOTE — Assessment & Plan Note (Signed)
Not bad Hasn't needed prn meds of late

## 2010-08-08 NOTE — Patient Instructions (Signed)
Will plan follow up in about 3 months 

## 2010-08-08 NOTE — Assessment & Plan Note (Signed)
Mild with slight progression noted by staff Still no functional problems Probably related to Parkinsons Rx not appropriate

## 2010-08-08 NOTE — Progress Notes (Signed)
Faxed note to [facility]

## 2010-08-08 NOTE — Assessment & Plan Note (Signed)
Prefers to stay in room for meals She is satisfied with this No changes needed

## 2010-08-26 ENCOUNTER — Telehealth: Payer: Self-pay | Admitting: *Deleted

## 2010-08-26 NOTE — Telephone Encounter (Signed)
Patient had a hip replacement at a hospital in [facility] and will be returning to this area in the next few days.  Son Jonny Ruiz) would like to get a recommendation from you for an orthopedic surgeon that could follow up with stitches removed in a week or so and then a follow up with x-rays in 4-5 weeks.

## 2010-08-26 NOTE — Telephone Encounter (Signed)
We can set all that up once she is back I am assuming that she will be coming back to health care at [facility] for her rehab The nurses there can take out the staples or stitches and we can set up appt with orthopedist in town

## 2010-08-27 NOTE — Telephone Encounter (Signed)
Spoke with Gearldine Bienenstock at [facility] in [place], [place].  She called to see if we needed her to set up ortho appt here.  Per note below I told her that we would take care of that when pt returns back to this area.

## 2010-08-27 NOTE — Telephone Encounter (Signed)
Correct I will do all this from rehab when she is at [facility]

## 2010-08-28 DIAGNOSIS — F068 Other specified mental disorders due to known physiological condition: Secondary | ICD-10-CM

## 2010-08-28 DIAGNOSIS — G20 Parkinson's disease: Secondary | ICD-10-CM

## 2010-08-28 DIAGNOSIS — S72009A Fracture of unspecified part of neck of unspecified femur, initial encounter for closed fracture: Secondary | ICD-10-CM

## 2010-08-28 DIAGNOSIS — F411 Generalized anxiety disorder: Secondary | ICD-10-CM

## 2010-09-08 ENCOUNTER — Observation Stay: Payer: Self-pay | Admitting: Internal Medicine

## 2010-09-09 ENCOUNTER — Telehealth: Payer: Self-pay | Admitting: *Deleted

## 2010-09-09 DIAGNOSIS — J209 Acute bronchitis, unspecified: Secondary | ICD-10-CM

## 2010-09-09 DIAGNOSIS — F068 Other specified mental disorders due to known physiological condition: Secondary | ICD-10-CM

## 2010-09-09 NOTE — Telephone Encounter (Signed)
Hospitalized briefly I recheck her at [facility] this AM

## 2010-09-09 NOTE — Telephone Encounter (Signed)
[facility] Triage Call Report Triage Record Num: [id number] Operator: Caswell Corwin Patient Name: Rachel Faulkner Call Date & Time: 09/07/2010 7:46:46PM Patient Phone: [phone] PCP: Tillman Abide Patient Gender: Female PCP Fax : [phone] Patient DOB: [birth year] [facility] Name[facility] Reason for Call: Olegario Messier from [facility] calling that pt has chest pain that started 1935. Now has O2 /2 liters as O2 sats were in the 80's. Afebrile. now. Triaged Chest Pain and having pressure and fullness. Inst to send out by 911 for eval. Protocol(s) Used: Chest Pain Recommended Outcome per Protocol: Activate EMS 911 Reason for Outcome: Pressure, fullness, squeezing sensation or pain anywhere in the chest lasting 5 or more minutes now or within the last hour. Pain is NOT associated with taking a deep breath or a productive cough, movement, or touch to a localized area on the chest. Care Advice: ~ An adult should stay with the patient, preferably one trained in CPR. After calling EMS 911, have the person chew one aspirin tablet (325 mg), or 4 baby aspirin (81mg ) with a small amount of water now if conscious, not allergic to aspirin, or if has not been told to avoid taking aspirin by their provider. It is important to use aspirin, not acetaminophen. ~ 09/07/2010 7:51:21PM Page 1 of 1 [email or web address]_[email or web address]_V2

## 2010-09-10 ENCOUNTER — Telehealth: Payer: Self-pay | Admitting: *Deleted

## 2010-09-10 NOTE — Telephone Encounter (Signed)
Will check on her at [facility] today if needed

## 2010-09-10 NOTE — Telephone Encounter (Signed)
[facility] Triage Call Report Triage Record Num: [id number] Operator: Sula Rumple Patient Name: Rachel Faulkner Call Date & Time: 09/09/2010 9:21:57PM Patient Phone: [phone] PCP: Tillman Abide Patient Gender: Female PCP Fax : [phone] Patient DOB: [birth year] Practice Name[facility] Reason for Call: Hilda Lias, , calling regarding . PCP is Alphonsus Sias ([facility]), Garnetta Buddy number is [phone]. Marie/LPN calling on 29/52/84 from [facility] states pt fell out of her bed/hit the back of her head/pt was confused prior to the fall and after/neuro checks q 15 minutes/within normal/pt has developed swelling in her knee/all emergent sx ruled out per Knee Injury Protocol Protocol(s) Used: Knee Injury Recommended Outcome per Protocol: Provide Home/Self Care Reason for Outcome: Twisting injury with minimal symptoms now Care Advice: Analgesic/Antipyretic Advice - Acetaminophen: Consider acetaminophen as directed on label or by pharmacist/provider for pain or fever PRECAUTIONS: - Use if there is no history of liver disease, alcoholism, or intake of three or more alcohol drinks per day - Only if approved by provider during pregnancy or when breastfeeding - During pregnancy, acetaminophen should not be taken more than 3 consecutive days without telling provider - Do not exceed recommended dose or frequency ~ To limit swelling and reduce pain: - Protect the knee and prevent movement by limiting weight bearing, especially going down stairs, strenuous exercise, or any activity that makes the pain worse. - Apply a cloth-covered ice pack to the extremity for no more than 20 minutes, 4 to 8 times a day. Ice helps relieve pain and swelling. - Apply an elastic bandage to the extremity to limit the swelling. Do not wrap extremity too tightly. - Elevate the extremity above the level of the heart. ~ Prevention: Not all knee injuries can be prevented. The following may help promote  stability to the knee. - Increase activity level slowly - Always warm up before and cool down after exercising - Stretch quadriceps and hamstrings during cool down - Do specific exercises to strengthen leg muscles - Wear properly fitting shoes for foot shape and replace every 3 to 6 months - Maintain healthy weight to reduce stress on knees. ~ 09/09/2010 9:34:20PM Page 1 of 1 CAN_TriageRpt_V2

## 2010-09-25 ENCOUNTER — Telehealth: Payer: Self-pay | Admitting: *Deleted

## 2010-09-25 NOTE — Telephone Encounter (Signed)
That is okay It can be used prn if her tremors are worse Actually better if she doesn't need it regularly

## 2010-09-25 NOTE — Telephone Encounter (Signed)
FYI:   Prescription claims suggest that your patient may potentially be nonadherent with Primidone 50 mg.  A 30 day supply was last filled on 06/21/2010.  No reply to this communication is necessary.

## 2010-09-26 DIAGNOSIS — F411 Generalized anxiety disorder: Secondary | ICD-10-CM

## 2010-09-26 DIAGNOSIS — F068 Other specified mental disorders due to known physiological condition: Secondary | ICD-10-CM

## 2010-09-26 DIAGNOSIS — G20 Parkinson's disease: Secondary | ICD-10-CM

## 2010-10-08 DIAGNOSIS — L723 Sebaceous cyst: Secondary | ICD-10-CM

## 2010-10-22 ENCOUNTER — Emergency Department: Payer: Self-pay | Admitting: Emergency Medicine

## 2010-10-23 DIAGNOSIS — R5383 Other fatigue: Secondary | ICD-10-CM

## 2010-10-23 DIAGNOSIS — I10 Essential (primary) hypertension: Secondary | ICD-10-CM

## 2010-10-23 DIAGNOSIS — R5381 Other malaise: Secondary | ICD-10-CM

## 2010-10-23 DIAGNOSIS — F329 Major depressive disorder, single episode, unspecified: Secondary | ICD-10-CM

## 2010-10-23 DIAGNOSIS — F0390 Unspecified dementia without behavioral disturbance: Secondary | ICD-10-CM

## 2010-12-06 DIAGNOSIS — F411 Generalized anxiety disorder: Secondary | ICD-10-CM

## 2010-12-06 DIAGNOSIS — S72009A Fracture of unspecified part of neck of unspecified femur, initial encounter for closed fracture: Secondary | ICD-10-CM

## 2010-12-06 DIAGNOSIS — F329 Major depressive disorder, single episode, unspecified: Secondary | ICD-10-CM

## 2010-12-06 DIAGNOSIS — I10 Essential (primary) hypertension: Secondary | ICD-10-CM

## 2011-02-06 DIAGNOSIS — G20 Parkinson's disease: Secondary | ICD-10-CM

## 2011-02-06 DIAGNOSIS — F411 Generalized anxiety disorder: Secondary | ICD-10-CM

## 2011-02-06 DIAGNOSIS — F068 Other specified mental disorders due to known physiological condition: Secondary | ICD-10-CM

## 2011-02-06 DIAGNOSIS — M810 Age-related osteoporosis without current pathological fracture: Secondary | ICD-10-CM

## 2011-02-14 DIAGNOSIS — J309 Allergic rhinitis, unspecified: Secondary | ICD-10-CM

## 2011-02-24 DIAGNOSIS — R5381 Other malaise: Secondary | ICD-10-CM

## 2011-02-24 DIAGNOSIS — R5383 Other fatigue: Secondary | ICD-10-CM

## 2011-03-03 DIAGNOSIS — I69998 Other sequelae following unspecified cerebrovascular disease: Secondary | ICD-10-CM

## 2011-03-03 DIAGNOSIS — R209 Unspecified disturbances of skin sensation: Secondary | ICD-10-CM

## 2011-03-27 DIAGNOSIS — F068 Other specified mental disorders due to known physiological condition: Secondary | ICD-10-CM

## 2011-03-27 DIAGNOSIS — F411 Generalized anxiety disorder: Secondary | ICD-10-CM

## 2011-03-27 DIAGNOSIS — G030 Nonpyogenic meningitis: Secondary | ICD-10-CM

## 2011-03-27 DIAGNOSIS — M810 Age-related osteoporosis without current pathological fracture: Secondary | ICD-10-CM

## 2011-05-20 ENCOUNTER — Telehealth: Payer: Self-pay | Admitting: Internal Medicine

## 2011-05-20 ENCOUNTER — Emergency Department: Payer: Self-pay | Admitting: *Deleted

## 2011-05-20 LAB — URINALYSIS, COMPLETE
Bilirubin,UR: NEGATIVE
Glucose,UR: NEGATIVE mg/dL (ref 0–75)
Ph: 5 (ref 4.5–8.0)
RBC,UR: 4 /HPF (ref 0–5)
Specific Gravity: 1.018 (ref 1.003–1.030)

## 2011-05-20 LAB — COMPREHENSIVE METABOLIC PANEL
Alkaline Phosphatase: 77 U/L (ref 50–136)
Anion Gap: 6 — ABNORMAL LOW (ref 7–16)
Bilirubin,Total: 0.4 mg/dL (ref 0.2–1.0)
Calcium, Total: 8.9 mg/dL (ref 8.5–10.1)
Chloride: 100 mmol/L (ref 98–107)
Co2: 32 mmol/L (ref 21–32)
Creatinine: 0.74 mg/dL (ref 0.60–1.30)
EGFR (African American): >60
Sodium: 138 mmol/L (ref 136–145)

## 2011-05-20 LAB — TROPONIN I
Troponin-I: <0.02 ng/mL
Troponin-I: <0.02 ng/mL

## 2011-05-20 LAB — CBC: HCT: 37.6 % (ref 35.0–47.0)

## 2011-05-20 LAB — CK TOTAL AND CKMB (NOT AT ARMC): CK-MB: <0.5 ng/mL — ABNORMAL LOW (ref 0.5–3.6)

## 2011-05-20 NOTE — Telephone Encounter (Signed)
Triage Record Num: [id number] Operator: Migdalia Dk Patient Name: Rachel Faulkner Call Date & Time: 05/19/2011 11:58:49PM Patient Phone: [phone] PCP: Tillman Abide Patient Gender: Female PCP Fax : [phone] Patient DOB: [birth year] [facility] Name[facility] Reason for Call: Caller: Marie/LPN; PCP: Tillman Abide I.; CB#: [phone]; Call regarding C/o chest pain; C/o chest pain over past 30 min. Temp. 98, pulse 68, resp 20, BP 140/70. Nurse will call 911 and send pt. to ER. Protocol(s) Used: Chest Pain Recommended Outcome per Protocol: Activate EMS 911 Reason for Outcome: Pressure, fullness, squeezing sensation or pain anywhere in the chest lasting 5 or more minutes now or within the last hour. Pain is NOT associated with taking a deep breath or a productive cough, movement, or touch to a localized area on the chest. Care Advice: ~ 05/20/2011 12:01:28AM Page 1 of 1 CAN_TriageRpt_V2

## 2011-05-20 NOTE — Telephone Encounter (Signed)
Will review status at [facility] today

## 2011-05-22 LAB — URINE CULTURE

## 2011-05-23 DIAGNOSIS — M542 Cervicalgia: Secondary | ICD-10-CM

## 2011-05-26 ENCOUNTER — Ambulatory Visit: Payer: Self-pay | Admitting: Internal Medicine

## 2011-05-27 ENCOUNTER — Encounter: Payer: Self-pay | Admitting: Internal Medicine

## 2011-07-08 DIAGNOSIS — L89609 Pressure ulcer of unspecified heel, unspecified stage: Secondary | ICD-10-CM

## 2011-08-07 DIAGNOSIS — M810 Age-related osteoporosis without current pathological fracture: Secondary | ICD-10-CM

## 2011-08-07 DIAGNOSIS — F411 Generalized anxiety disorder: Secondary | ICD-10-CM

## 2011-08-07 DIAGNOSIS — F068 Other specified mental disorders due to known physiological condition: Secondary | ICD-10-CM

## 2011-08-07 DIAGNOSIS — G20 Parkinson's disease: Secondary | ICD-10-CM

## 2011-09-03 DIAGNOSIS — S81809A Unspecified open wound, unspecified lower leg, initial encounter: Secondary | ICD-10-CM

## 2011-09-03 DIAGNOSIS — S91009A Unspecified open wound, unspecified ankle, initial encounter: Secondary | ICD-10-CM

## 2011-09-03 DIAGNOSIS — S81009A Unspecified open wound, unspecified knee, initial encounter: Secondary | ICD-10-CM

## 2011-10-03 DIAGNOSIS — F411 Generalized anxiety disorder: Secondary | ICD-10-CM

## 2011-10-03 DIAGNOSIS — F329 Major depressive disorder, single episode, unspecified: Secondary | ICD-10-CM

## 2011-10-03 DIAGNOSIS — I10 Essential (primary) hypertension: Secondary | ICD-10-CM

## 2011-10-03 DIAGNOSIS — M810 Age-related osteoporosis without current pathological fracture: Secondary | ICD-10-CM

## 2011-10-03 DIAGNOSIS — M159 Polyosteoarthritis, unspecified: Secondary | ICD-10-CM

## 2011-10-03 DIAGNOSIS — F3289 Other specified depressive episodes: Secondary | ICD-10-CM

## 2011-12-04 DIAGNOSIS — M810 Age-related osteoporosis without current pathological fracture: Secondary | ICD-10-CM

## 2011-12-04 DIAGNOSIS — G20 Parkinson's disease: Secondary | ICD-10-CM

## 2011-12-04 DIAGNOSIS — F411 Generalized anxiety disorder: Secondary | ICD-10-CM

## 2011-12-04 DIAGNOSIS — F068 Other specified mental disorders due to known physiological condition: Secondary | ICD-10-CM

## 2012-01-08 DIAGNOSIS — L84 Corns and callosities: Secondary | ICD-10-CM

## 2012-02-05 DIAGNOSIS — F411 Generalized anxiety disorder: Secondary | ICD-10-CM

## 2012-02-05 DIAGNOSIS — M159 Polyosteoarthritis, unspecified: Secondary | ICD-10-CM

## 2012-02-05 DIAGNOSIS — G20 Parkinson's disease: Secondary | ICD-10-CM

## 2012-02-05 DIAGNOSIS — F068 Other specified mental disorders due to known physiological condition: Secondary | ICD-10-CM

## 2012-04-14 DIAGNOSIS — E785 Hyperlipidemia, unspecified: Secondary | ICD-10-CM

## 2012-04-14 DIAGNOSIS — F329 Major depressive disorder, single episode, unspecified: Secondary | ICD-10-CM

## 2012-04-14 DIAGNOSIS — I10 Essential (primary) hypertension: Secondary | ICD-10-CM

## 2012-04-14 DIAGNOSIS — M810 Age-related osteoporosis without current pathological fracture: Secondary | ICD-10-CM

## 2012-04-14 DIAGNOSIS — F0390 Unspecified dementia without behavioral disturbance: Secondary | ICD-10-CM

## 2012-04-26 DIAGNOSIS — R609 Edema, unspecified: Secondary | ICD-10-CM

## 2012-04-26 DIAGNOSIS — L84 Corns and callosities: Secondary | ICD-10-CM

## 2012-06-03 ENCOUNTER — Other Ambulatory Visit: Payer: Self-pay | Admitting: Internal Medicine

## 2012-06-04 NOTE — Telephone Encounter (Signed)
rx called to pharmacy 

## 2012-06-04 NOTE — Telephone Encounter (Signed)
Okay to refill but please call [facility] and find out why they are now sending [facility] refills here

## 2012-06-17 DIAGNOSIS — G20 Parkinson's disease: Secondary | ICD-10-CM

## 2012-06-17 DIAGNOSIS — F411 Generalized anxiety disorder: Secondary | ICD-10-CM

## 2012-06-17 DIAGNOSIS — F068 Other specified mental disorders due to known physiological condition: Secondary | ICD-10-CM

## 2012-06-17 DIAGNOSIS — M159 Polyosteoarthritis, unspecified: Secondary | ICD-10-CM

## 2012-08-12 DIAGNOSIS — F0280 Dementia in other diseases classified elsewhere without behavioral disturbance: Secondary | ICD-10-CM

## 2012-08-12 DIAGNOSIS — M159 Polyosteoarthritis, unspecified: Secondary | ICD-10-CM

## 2012-08-12 DIAGNOSIS — G20A1 Parkinson's disease without dyskinesia, without mention of fluctuations: Secondary | ICD-10-CM

## 2012-08-12 DIAGNOSIS — G3183 Dementia with Lewy bodies: Secondary | ICD-10-CM

## 2012-08-12 DIAGNOSIS — F411 Generalized anxiety disorder: Secondary | ICD-10-CM

## 2012-08-12 DIAGNOSIS — G20 Parkinson's disease: Secondary | ICD-10-CM

## 2012-08-13 IMAGING — CT CT HEAD WITHOUT CONTRAST
2 series · 16 of 30 positions shown, 20 images · non-contrast
Comparison: none

REASON FOR EXAM: ams
COMMENTS:

PROCEDURE:     CT  - CT HEAD WITHOUT CONTRAST  - June 19, 2010  [DATE]
RESULT:     Head CT dated 06/19/2010.
TECHNIQUE: Helical noncontrasted 5 mm sections are obtained from skull base
to the vertex.

[Series 2: without · axial · non-contrast · 0.43mm/px · z∈[-244,-124]mm · 13 of 29 slices shown, 17 images]
[im 3/29  brain]
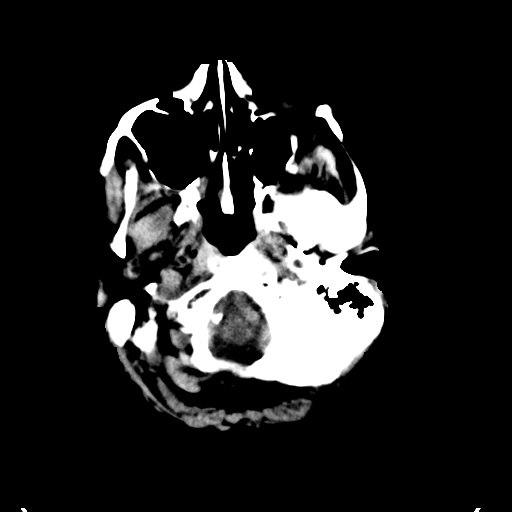
[im 3/29  bone]
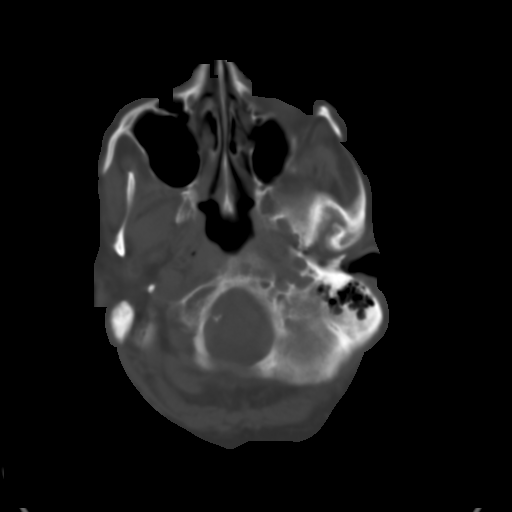
[im 5/29  brain]
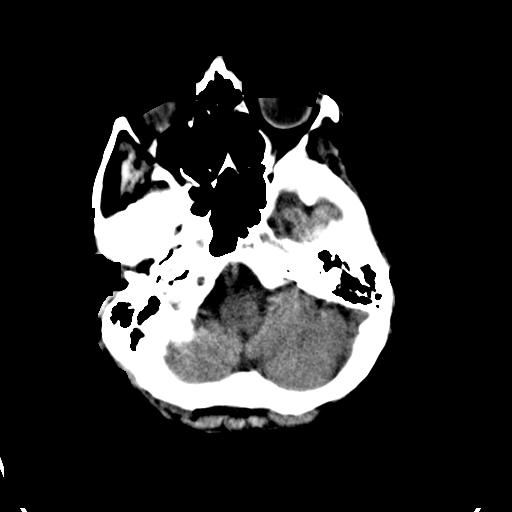
[im 7/29  brain]
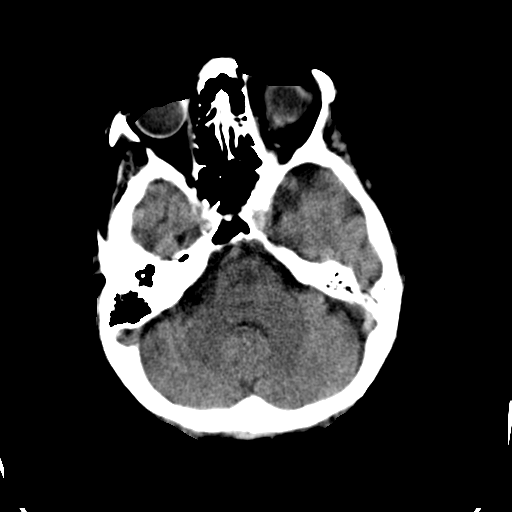
[im 9/29  brain]
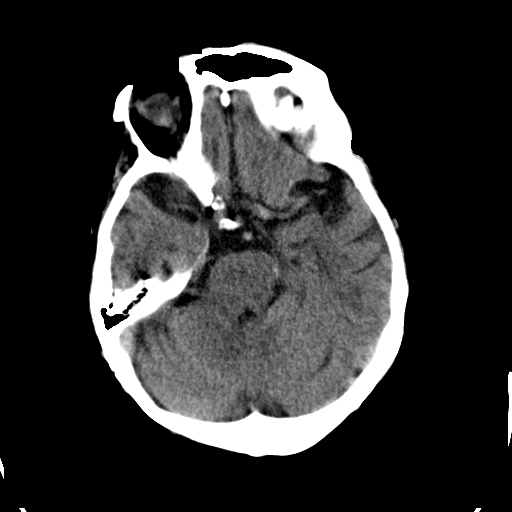
[im 11/29  brain]
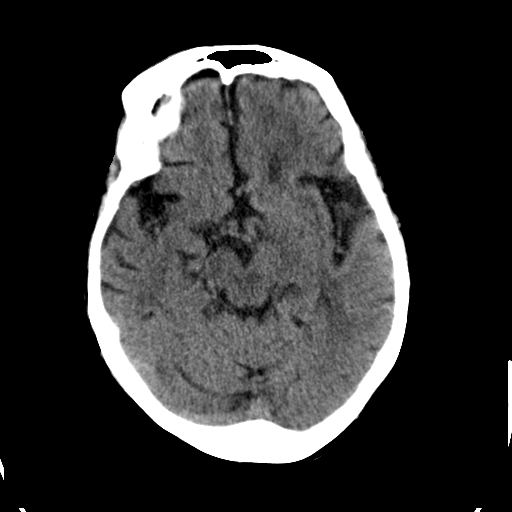
[im 11/29  bone]
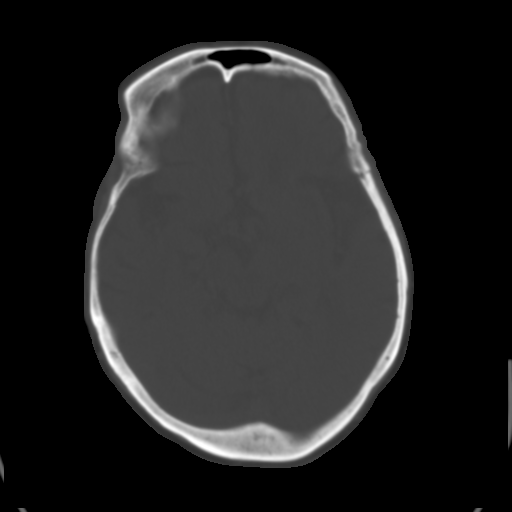
[im 13/29  brain]
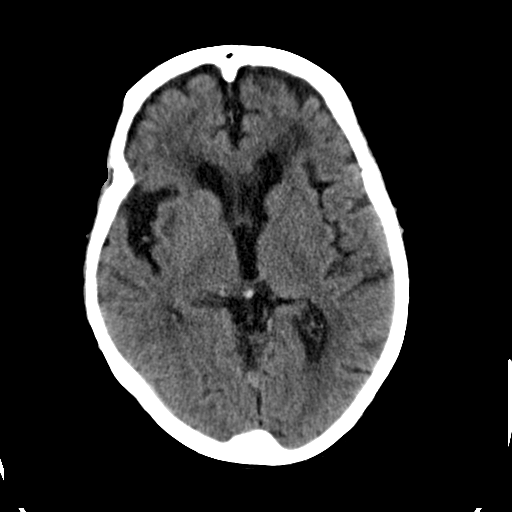
[im 15/29  brain]
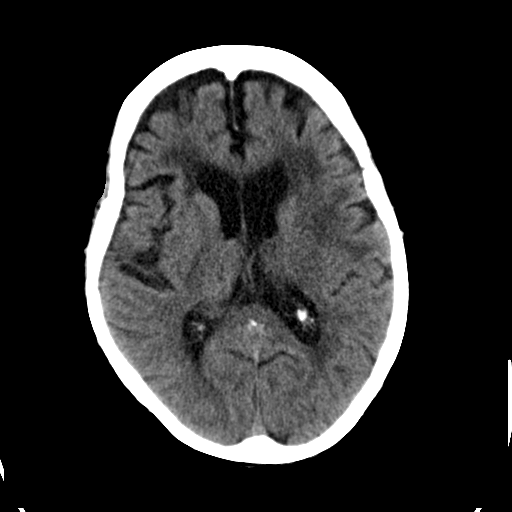
[im 17/29  brain]
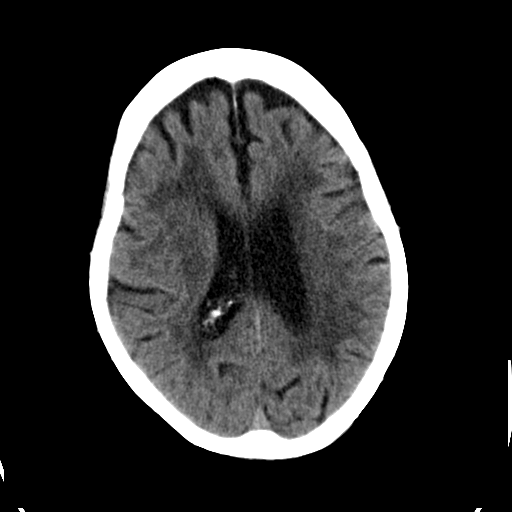
[im 19/29  brain]
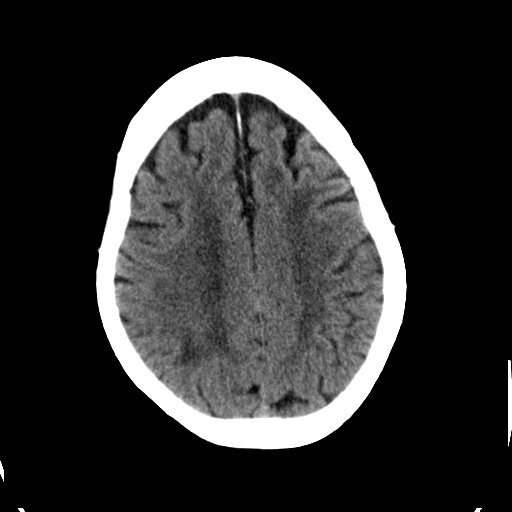
[im 19/29  bone]
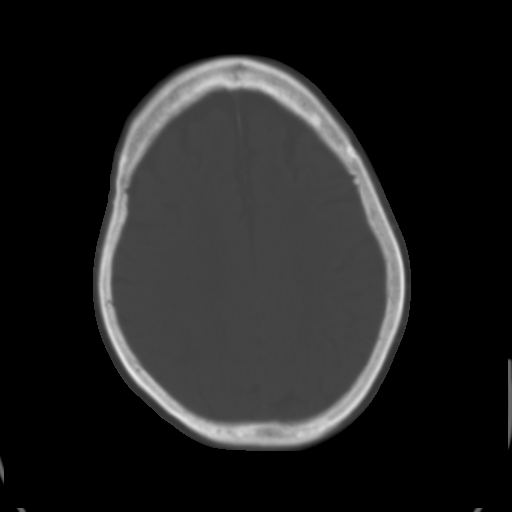
[im 21/29  brain]
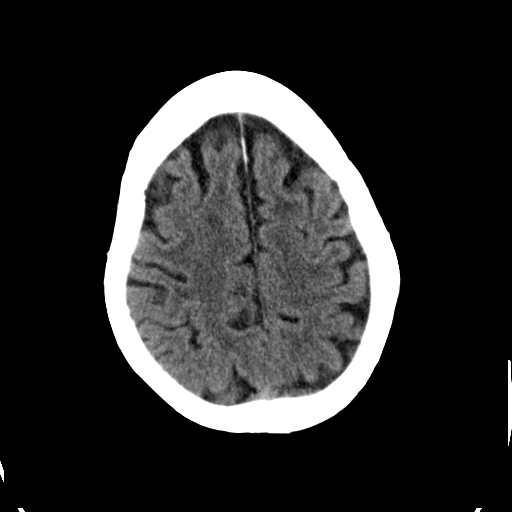
[im 23/29  brain]
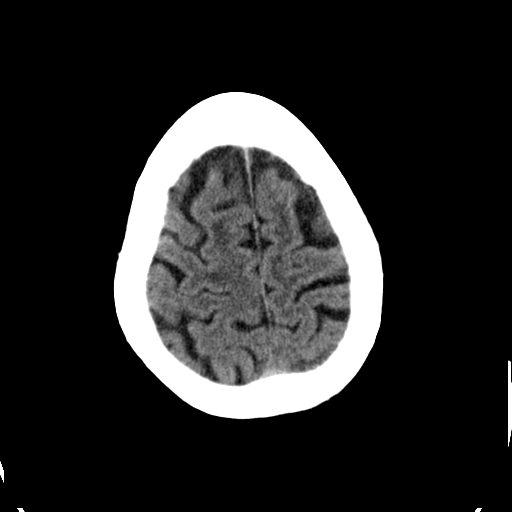
[im 25/29  brain]
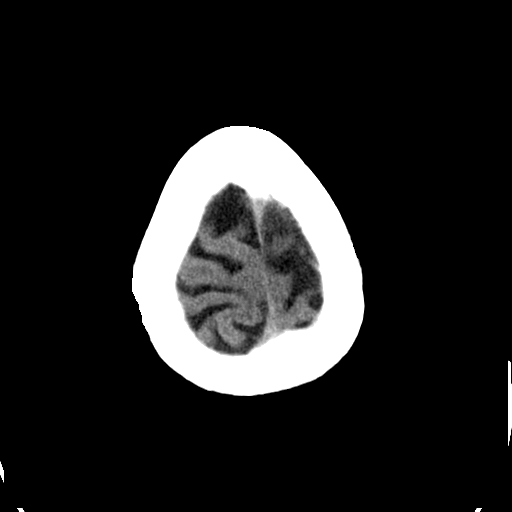
[im 27/29  brain]
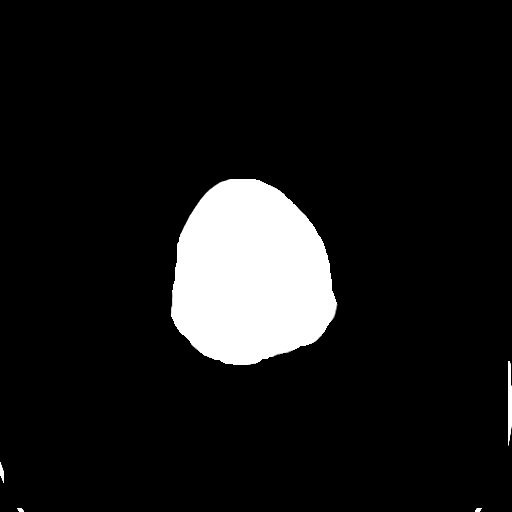
[im 27/29  bone]
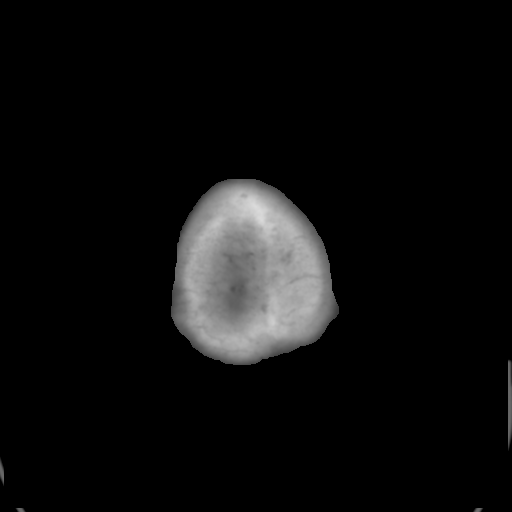

[Series 3: bone · axial · 0.43mm/px · z∈[-244,-204]mm · 3 of 29 slices shown]
[im 3/29  bone]
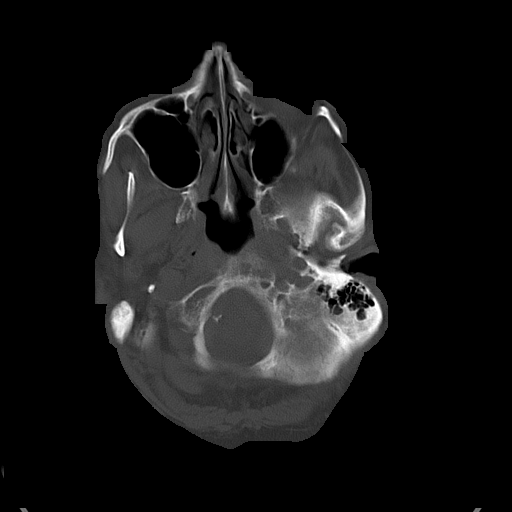
[im 7/29  bone]
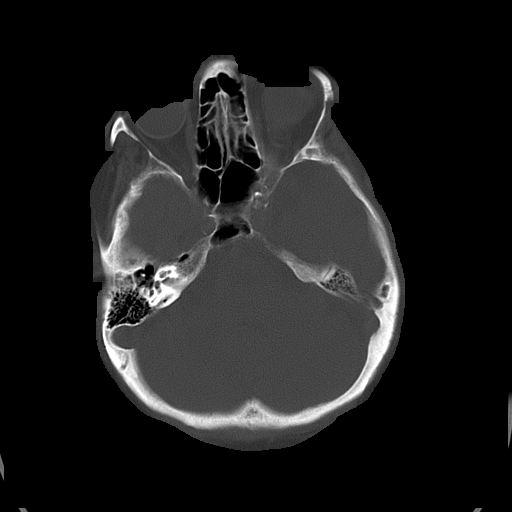
[im 11/29  bone]
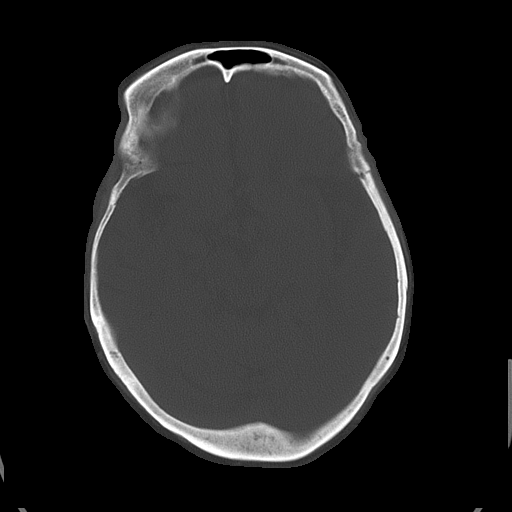

[16 of 30 positions shown; findings below may reference images not displayed]

FINDINGS: There is no evidence of intra-axial nor extra-axial fluid
collections nor evidence of acute hemorrhage. Areas of low attenuation
project within the subcortical, deep, and periventricular white matter
regions. There is diffuse cortical atrophy. Hydrocephalus ex vacuo is
identified. The pons and cerebellum are unremarkable. There is no evidence
of subfalcine nor tonsillar herniation. There is no evidence of a depressed
skull fracture.
IMPRESSION: Chronic and involutional changes without evidence of focal
or acute abnormalities.

## 2012-08-20 DIAGNOSIS — F411 Generalized anxiety disorder: Secondary | ICD-10-CM

## 2012-09-23 DIAGNOSIS — G20 Parkinson's disease: Secondary | ICD-10-CM

## 2012-09-23 DIAGNOSIS — F0150 Vascular dementia without behavioral disturbance: Secondary | ICD-10-CM

## 2012-09-23 DIAGNOSIS — M1710 Unilateral primary osteoarthritis, unspecified knee: Secondary | ICD-10-CM

## 2012-09-23 DIAGNOSIS — R609 Edema, unspecified: Secondary | ICD-10-CM

## 2012-09-23 DIAGNOSIS — F409 Phobic anxiety disorder, unspecified: Secondary | ICD-10-CM

## 2012-11-25 DIAGNOSIS — F411 Generalized anxiety disorder: Secondary | ICD-10-CM

## 2012-11-25 DIAGNOSIS — M159 Polyosteoarthritis, unspecified: Secondary | ICD-10-CM

## 2012-11-25 DIAGNOSIS — F0150 Vascular dementia without behavioral disturbance: Secondary | ICD-10-CM

## 2012-11-25 DIAGNOSIS — G20 Parkinson's disease: Secondary | ICD-10-CM

## 2012-12-02 ENCOUNTER — Encounter: Payer: Self-pay | Admitting: Podiatry

## 2012-12-09 ENCOUNTER — Ambulatory Visit: Payer: Self-pay | Admitting: Podiatrist

## 2012-12-23 ENCOUNTER — Ambulatory Visit (INDEPENDENT_AMBULATORY_CARE_PROVIDER_SITE_OTHER): Payer: Federal, State, Local not specified - PPO | Admitting: Podiatrist

## 2012-12-23 ENCOUNTER — Encounter: Payer: Self-pay | Admitting: Podiatrist

## 2012-12-23 VITALS — BP 136/69 | HR 73 | Resp 16 | Ht 60.0 in | Wt 135.0 lb

## 2012-12-23 DIAGNOSIS — M79609 Pain in unspecified limb: Secondary | ICD-10-CM

## 2012-12-23 DIAGNOSIS — B351 Tinea unguium: Secondary | ICD-10-CM

## 2012-12-23 NOTE — Progress Notes (Signed)
  HPI:  Patient presents today for foot and nail care.  Patient states her nails are very long and painful.  She presents today with her son. She also states she has a sore on her heel. She denies any drainage, redness or pain from the heel sore.   Objective:  Patients chart is reviewed.  Neurovascular status reveals intact sensation.  Patient has decreased circulation with swollen feet and shiny atrophic skin.  Rubor to the feet also present.  Pedal pulse is palpable and thready dp bilateral, pt puls unable to be palpated.  A stable eschar is present on the posterior aspect of the right heel from pressure.  She has had the heel sore for several months and it is being monitored at her facility.  Patients nails are thickened, discolored, distrophic, friable and brittle with yellow-brown discoloration. Patient subjectively relates they are painful with shoes and with ambulation of bilateral feet.  Assessment:  Symptomatic onychomycosis, stable heel pressure sore  Plan:  Discussed treatment options and alternatives.  The symptomatic toenails were debrided through manual an mechanical means without complication.  Return appointment recommended at routine intervals of 3 months    Marlowe Aschoff, DPM

## 2012-12-23 NOTE — Patient Instructions (Signed)
Keep pressure off of your heels with a pillow behind your lower legs or foam offloading booties,  If you have any problems with your toenails, let me know!

## 2013-01-07 ENCOUNTER — Emergency Department: Payer: Self-pay | Admitting: Emergency Medicine

## 2013-01-08 LAB — COMPREHENSIVE METABOLIC PANEL
Alkaline Phosphatase: 81 U/L
Bilirubin,Total: 0.4 mg/dL (ref 0.2–1.0)
Chloride: 100 mmol/L (ref 98–107)
Co2: 29 mmol/L (ref 21–32)
EGFR (African American): >60
EGFR (Non-African Amer.): >60
Glucose: 112 mg/dL — ABNORMAL HIGH (ref 65–99)
Osmolality: 272 (ref 275–301)
Potassium: 3.8 mmol/L (ref 3.5–5.1)
SGOT(AST): 12 U/L — ABNORMAL LOW (ref 15–37)
SGPT (ALT): 8 U/L — ABNORMAL LOW (ref 12–78)
Total Protein: 7.3 g/dL (ref 6.4–8.2)

## 2013-01-08 LAB — CBC WITH DIFFERENTIAL/PLATELET
Basophil #: 0 10*3/uL (ref 0.0–0.1)
Eosinophil #: 0 10*3/uL (ref 0.0–0.7)
Eosinophil %: 0 %
Lymphocyte #: 1.2 10*3/uL (ref 1.0–3.6)
Lymphocyte %: 16.1 %
MCH: 29.2 pg (ref 26.0–34.0)
MCHC: 32.9 g/dL (ref 32.0–36.0)
MCV: 89 fL (ref 80–100)
Monocyte #: 0.5 x10 3/mm (ref 0.2–0.9)
Monocyte %: 7.3 %
Neutrophil %: 76.2 %
Platelet: 240 10*3/uL (ref 150–440)
RBC: 4.59 10*6/uL (ref 3.80–5.20)

## 2013-01-08 LAB — URINALYSIS, COMPLETE
Bilirubin,UR: NEGATIVE
Blood: NEGATIVE
Glucose,UR: NEGATIVE mg/dL (ref 0–75)
Ph: 5 (ref 4.5–8.0)
Protein: 30
Specific Gravity: 1.021 (ref 1.003–1.030)
WBC UR: 198 /HPF (ref 0–5)

## 2013-01-08 LAB — RAPID INFLUENZA A&B ANTIGENS

## 2013-01-10 ENCOUNTER — Telehealth: Payer: Self-pay | Admitting: Family Medicine

## 2013-01-10 NOTE — Telephone Encounter (Signed)
Noted  

## 2013-01-10 NOTE — Telephone Encounter (Signed)
[facility] Triage Call Report Triage Record Num: [id number] Operator: Claudie Leach Patient Name: Rachel Faulkner Call Date & Time: 01/07/2013 10:26:41PM Patient Phone: [phone] PCP: Tillman Abide Caller Name: Burgess Estelle Relationship to Patient: Unknown Patient Gender: Female PCP Fax : [phone] Patient DOB: 10/02/1914 [facility] Name[facility] Reason for Call: Tom/calling regarding hoarseness, wheezing, sore throat, productive cough of clear phlegm and fever of 100 orally. Started on 01/07/13 at 7:30 pm. Had a dose of Tylenol 650 mg for fever at 9 pm and Tussin 10 ml at 9:25 pm. Vital signs are pulse 85, resp 28 and blood pressure 112/78. Triaged per Cough Adult guideline. To call provider immediately due to any temperature elevation in an immunocompromised individual or a frail elderly person. Care advice given. Advised ED and notified Dr. Beverely Low and she agreed. Caller made aware and agreed. Protocol(s) Used: Cough - Adult Recommended Outcome per Protocol: Call Provider Immediately Reason for Outcome: Any temperature elevation in an immunocompromised individual or a frail elderly person Care Advice: ~ SYMPTOM / CONDITION MANAGEMENT Analgesic/Antipyretic Advice - Acetaminophen: Consider acetaminophen as directed on label or by pharmacist/provider for pain or fever PRECAUTIONS: - Use if there is no history of liver disease, alcoholism, or intake of three or more alcohol drinks per day - Only if approved by provider during pregnancy or when breastfeeding - During pregnancy, acetaminophen should not be taken more than 3 consecutive days without telling provider - Do not exceed recommended dose or frequency ~ 01/07/2013 11:11:40PM Page 1 of 1 CAN_TriageRpt_V2

## 2013-03-07 ENCOUNTER — Telehealth: Payer: Self-pay | Admitting: Family Medicine

## 2013-03-07 NOTE — Telephone Encounter (Signed)
[facility] Triage Call Report Triage Record Num: [id number] Operator: Lesli Albeeracey McKinney Patient Name: Rachel CiproLila Faulkner Call Date & Time: 03/06/2013 5:39:43PM Patient Phone: [phone] PCP: Tillman Abideichard Letvak Patient Gender: Female PCP Fax : [phone] Patient DOB: 01-28-1914 [facility] Reason for Call: Caller: Darla LeschesLaura Or Stephanie (RN)/LPN; PCP: Tillman AbideLetvak , Richard [facility](Family Practice); CB#: [phone]; Call regarding Fall, need orders for hip X ray; Laura/ LPN is calling to say the pt fell onto her right hip. Pt was trying to get up on the bed by herself. When her leg is moved she cries out in pain. Pt has anxiety and is very anxious about her leg. Pt had a previous hip replacement on that hip. Her son would like mobile x-ray ordered. RN triaged per falls and ordered mobile x-ray of injured area per standing orders and per dispositon of "see Provider in 4 hours d/t severe pain with movement that limits normal activities. Protocol(s) Used: Falls Recommended Outcome per Protocol: See Provider within 4 hours Reason for Outcome: Severe pain with movement that limits normal activities Care Advice: ~ 03/06/2013 5:50:11PM Page 1 of 1 CAN_TriageRpt_V2

## 2013-03-07 NOTE — Telephone Encounter (Signed)
Her x-rays were all negative and she is doing better this AM I saw her at [facility]

## 2013-03-24 DIAGNOSIS — F411 Generalized anxiety disorder: Secondary | ICD-10-CM

## 2013-03-24 DIAGNOSIS — G20 Parkinson's disease: Secondary | ICD-10-CM

## 2013-03-24 DIAGNOSIS — F068 Other specified mental disorders due to known physiological condition: Secondary | ICD-10-CM

## 2013-03-24 DIAGNOSIS — M159 Polyosteoarthritis, unspecified: Secondary | ICD-10-CM

## 2013-05-25 DIAGNOSIS — G20 Parkinson's disease: Secondary | ICD-10-CM

## 2013-05-25 DIAGNOSIS — M1710 Unilateral primary osteoarthritis, unspecified knee: Secondary | ICD-10-CM

## 2013-05-25 DIAGNOSIS — IMO0002 Reserved for concepts with insufficient information to code with codable children: Secondary | ICD-10-CM

## 2013-05-25 DIAGNOSIS — F0150 Vascular dementia without behavioral disturbance: Secondary | ICD-10-CM

## 2013-05-25 DIAGNOSIS — F411 Generalized anxiety disorder: Secondary | ICD-10-CM

## 2013-06-23 ENCOUNTER — Other Ambulatory Visit: Payer: Self-pay | Admitting: *Deleted

## 2013-06-23 MED ORDER — HYDROCODONE-ACETAMINOPHEN 5-325 MG PO TABS: 1.0000 | ORAL_TABLET | ORAL | Status: DC | PRN

## 2013-06-23 NOTE — Telephone Encounter (Signed)
rx faxed to pharmacy manually  

## 2013-06-23 NOTE — Telephone Encounter (Signed)
Form on your desk  

## 2013-06-23 NOTE — Telephone Encounter (Signed)
Refill approved Please fax to [facility]

## 2013-07-14 IMAGING — CR DG CHEST 2V
1 series · 2 of 2 positions shown · non-contrast
Comparison: none

REASON FOR EXAM: pain
COMMENTS:   May transport without cardiac monitor

PROCEDURE:     DXR - DXR CHEST PA (OR AP) AND LATERAL  - May 20, 2011  [DATE]
RESULT:     Lungs clear. Cardiac structures are unremarkable. Mild
atelectasis left lung base.

[Series 1: w chest lat · 0.14mm/px · 2 of 2 slices shown]
[im 1/2]
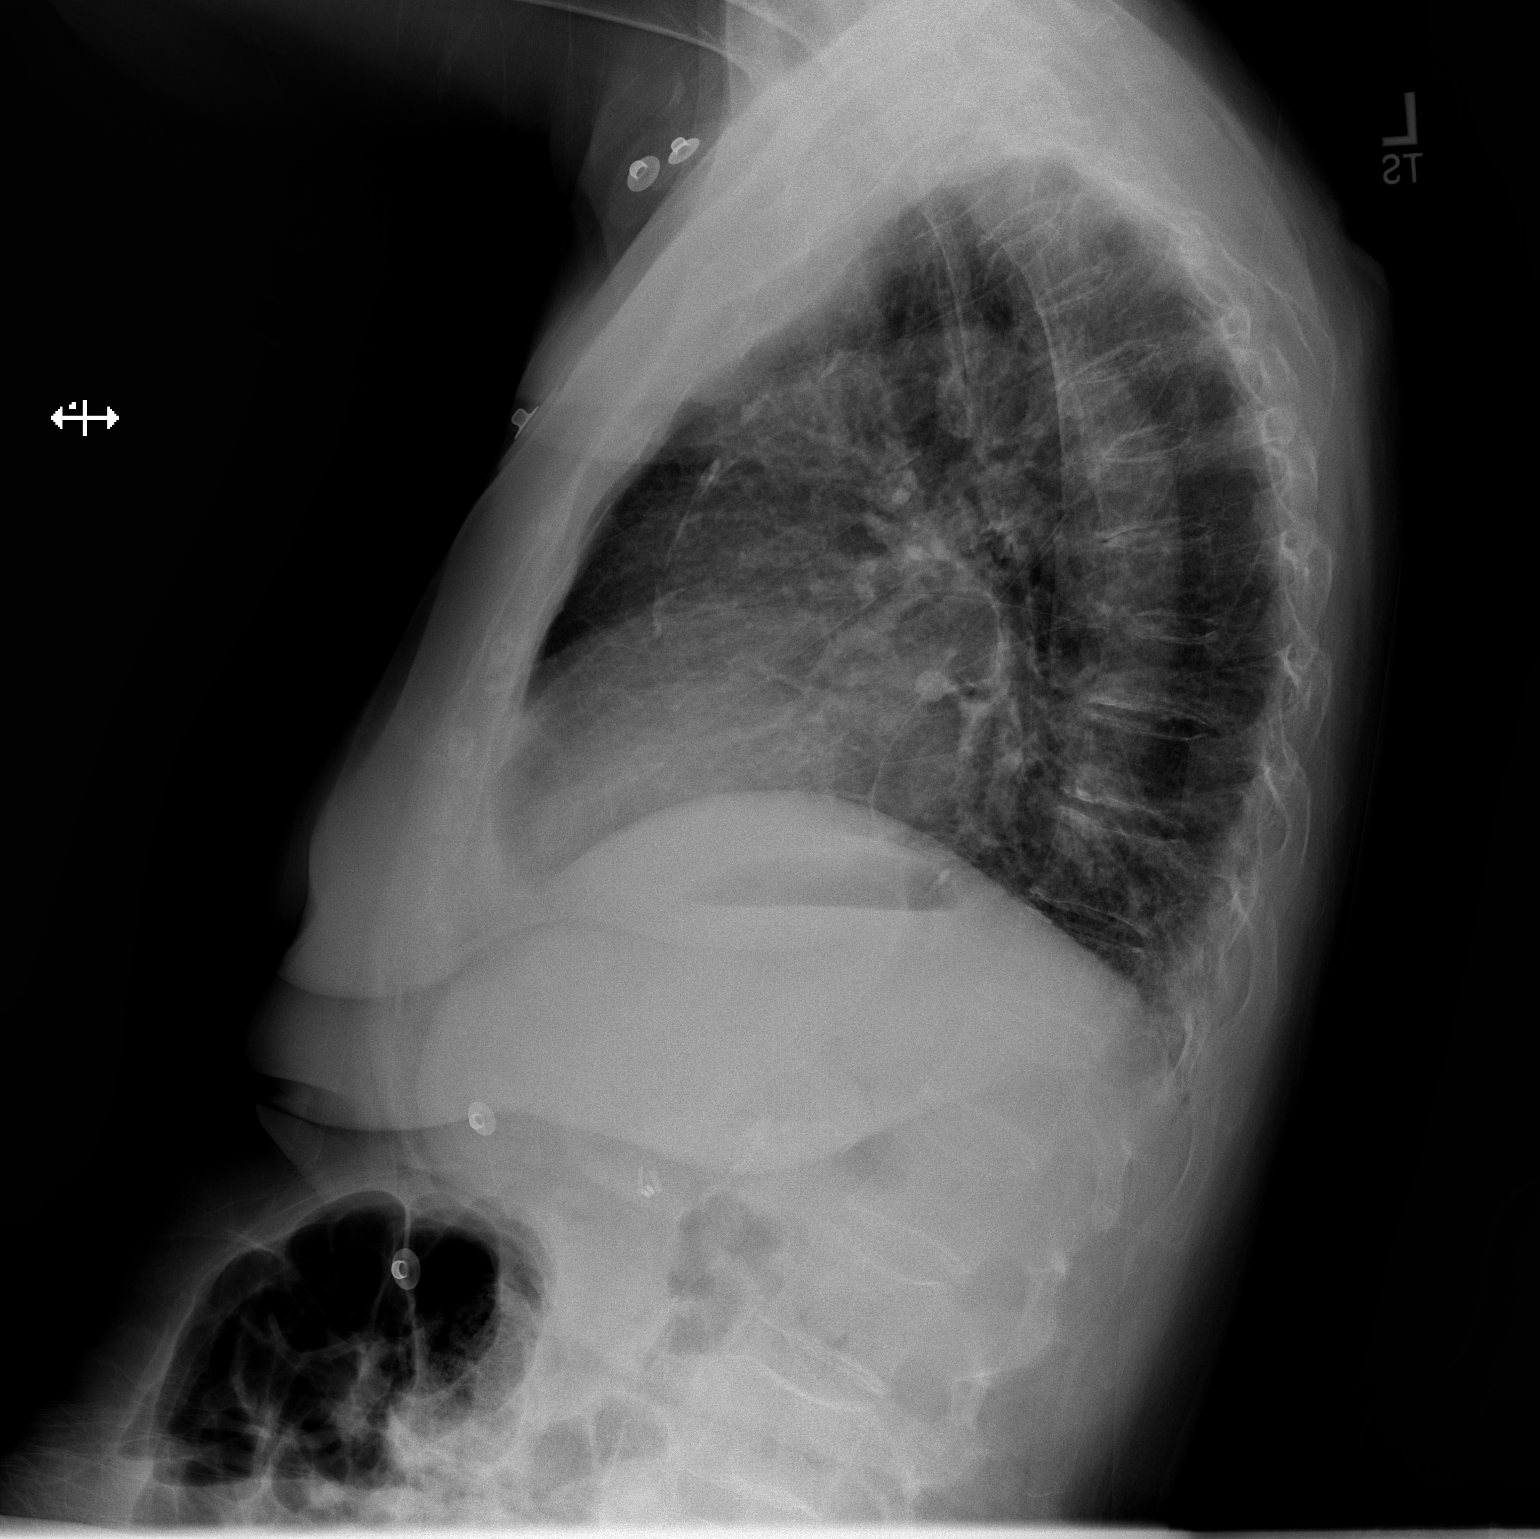
[im 2/2]
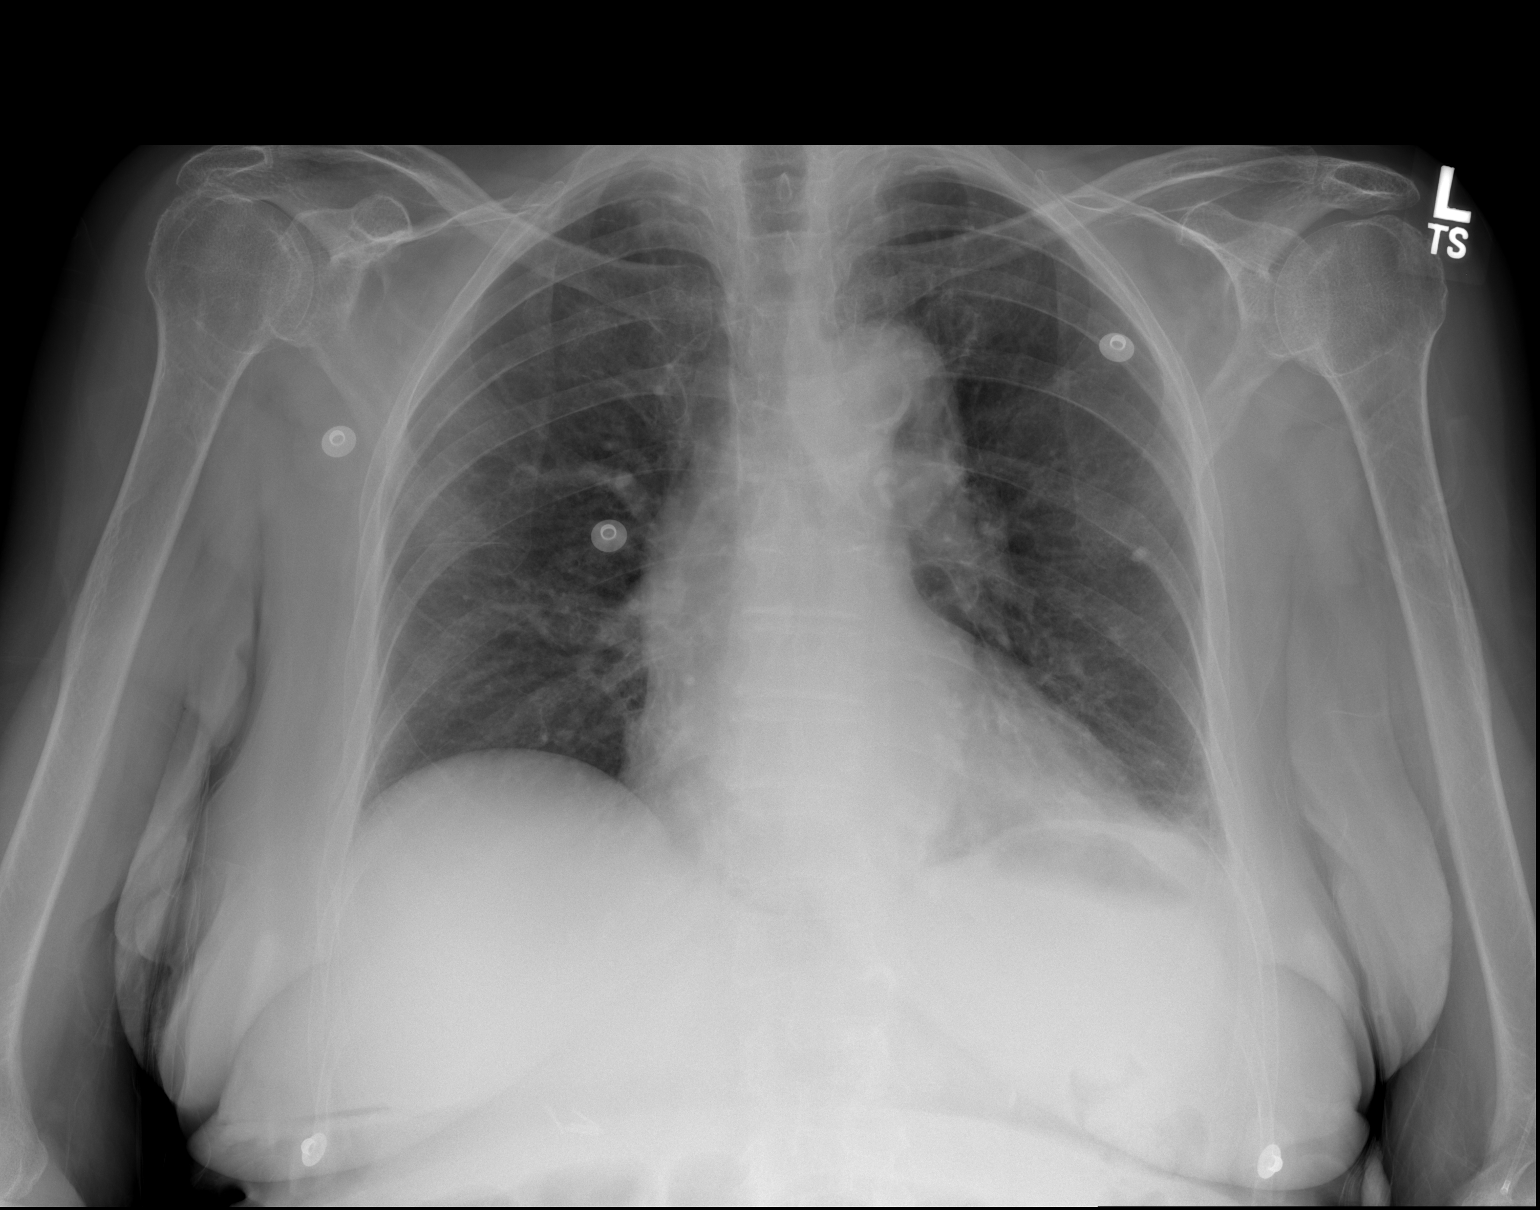

[2 of 2 positions shown; findings below may reference images not displayed]

IMPRESSION: No acute abnormality. Incidental note made of a calcified
granuloma left lung. Chest stable from 09/07/2010.

7

## 2013-08-02 DIAGNOSIS — F411 Generalized anxiety disorder: Secondary | ICD-10-CM

## 2013-08-02 DIAGNOSIS — M1710 Unilateral primary osteoarthritis, unspecified knee: Secondary | ICD-10-CM

## 2013-08-02 DIAGNOSIS — G20 Parkinson's disease: Secondary | ICD-10-CM

## 2013-08-02 DIAGNOSIS — F068 Other specified mental disorders due to known physiological condition: Secondary | ICD-10-CM

## 2013-08-02 DIAGNOSIS — IMO0002 Reserved for concepts with insufficient information to code with codable children: Secondary | ICD-10-CM

## 2013-09-06 ENCOUNTER — Emergency Department: Payer: Self-pay | Admitting: Emergency Medicine

## 2013-09-06 LAB — URINALYSIS, COMPLETE
BILIRUBIN, UR: NEGATIVE
Blood: NEGATIVE
Glucose,UR: NEGATIVE mg/dL (ref 0–75)
NITRITE: POSITIVE
PROTEIN: NEGATIVE
Ph: 5 (ref 4.5–8.0)
SPECIFIC GRAVITY: 1.02 (ref 1.003–1.030)
Squamous Epithelial: NONE SEEN

## 2013-09-06 LAB — BASIC METABOLIC PANEL
Anion Gap: 8 (ref 7–16)
BUN: 17 mg/dL (ref 7–18)
Calcium, Total: 9 mg/dL (ref 8.5–10.1)
Chloride: 101 mmol/L (ref 98–107)
Co2: 25 mmol/L (ref 21–32)
Creatinine: 0.86 mg/dL (ref 0.60–1.30)
EGFR (African American): >60
EGFR (Non-African Amer.): 57 — ABNORMAL LOW
Glucose: 117 mg/dL — ABNORMAL HIGH (ref 65–99)
Osmolality: 271 (ref 275–301)
Potassium: 4 mmol/L (ref 3.5–5.1)
Sodium: 134 mmol/L — ABNORMAL LOW (ref 136–145)

## 2013-09-06 LAB — CBC WITH DIFFERENTIAL/PLATELET
Basophil #: 0 10*3/uL (ref 0.0–0.1)
Basophil %: 0.4 %
Eosinophil #: 0.1 10*3/uL (ref 0.0–0.7)
Eosinophil %: 1 %
HCT: 42.5 % (ref 35.0–47.0)
HGB: 13.4 g/dL (ref 12.0–16.0)
Lymphocyte #: 1.2 10*3/uL (ref 1.0–3.6)
Lymphocyte %: 11.3 %
MCH: 29.2 pg (ref 26.0–34.0)
MCHC: 31.5 g/dL — ABNORMAL LOW (ref 32.0–36.0)
MCV: 93 fL (ref 80–100)
Monocyte #: 0.5 x10 3/mm (ref 0.2–0.9)
Monocyte %: 4.3 %
Neutrophil #: 8.9 10*3/uL — ABNORMAL HIGH (ref 1.4–6.5)
Neutrophil %: 83 %
Platelet: 333 10*3/uL (ref 150–440)
RBC: 4.59 10*6/uL (ref 3.80–5.20)
RDW: 13.2 % (ref 11.5–14.5)
WBC: 10.8 10*3/uL (ref 3.6–11.0)

## 2013-09-06 LAB — PROTIME-INR
INR: 1
Prothrombin Time: 13 secs (ref 11.5–14.7)

## 2013-09-06 LAB — TROPONIN I: Troponin-I: 0.03 ng/mL

## 2013-09-06 LAB — CK TOTAL AND CKMB (NOT AT ARMC)
CK, Total: 33 U/L
CK-MB: 0.5 ng/mL (ref 0.5–3.6)

## 2013-09-09 LAB — URINE CULTURE

## 2013-09-27 ENCOUNTER — Ambulatory Visit (INDEPENDENT_AMBULATORY_CARE_PROVIDER_SITE_OTHER): Payer: Federal, State, Local not specified - PPO | Admitting: Podiatry

## 2013-09-27 DIAGNOSIS — B351 Tinea unguium: Secondary | ICD-10-CM

## 2013-09-27 DIAGNOSIS — R234 Changes in skin texture: Secondary | ICD-10-CM

## 2013-09-27 DIAGNOSIS — M79676 Pain in unspecified toe(s): Secondary | ICD-10-CM

## 2013-09-27 DIAGNOSIS — M79609 Pain in unspecified limb: Secondary | ICD-10-CM

## 2013-09-27 NOTE — Progress Notes (Signed)
Patient ID: Rachel Faulkner, female   DOB: 1914/11/03, 80 y.o.   MRN: [id number]  Subjective:  80 year old female presents today for foot nail care. Patient presents today with a transporter from her facility. States they are unsure why she is here for this and for nails. Also for followup of wound on right heel. Denies any drainage. States that she has pain to her right heel. Patient states that her nails are painful. No other complaints. Denies any systemic complaints as fevers, chills, nausea, vomiting.  Objective: NAD, presents in a wheelchair Decreased DP/PT pulses. Bilateral pedal edema with shiny, atrophic skin. Rubor present b/l.  Nails hypertrophic, dystrophic, elongated, yellow discoloration x10. No surrounding erythema or drainage/purulence from around the nails.  Stable 1 x 1 cm eschar on right heel. No surrounding erythema, drainage, ascending cellulitis, fluctuance or crepitus. MMT decreased.    Assessment: 80 year old female with symptomatic onychomycosis, stable heel eschar.   Plan: -Treatment options discussed including alternatives, risks, complications. -She does have edematous, rubor appearance to her bilateral lower extremities however this appears to be unchanged from what was documented prior. These changes are likely result of poor circulation in her feet are constantly and are constantly in a dependent position. -Nail sharply debrided x10 without complications. -Recommended Multi-Podus boots to help offload the heel. -Monitor for any signs or symptoms of infection and directed to call the office immediately or go directly to the emergency room if any are to occur. -Followup in 3 months or sooner if needed.

## 2013-09-28 DIAGNOSIS — F411 Generalized anxiety disorder: Secondary | ICD-10-CM

## 2013-09-28 DIAGNOSIS — F0390 Unspecified dementia without behavioral disturbance: Secondary | ICD-10-CM

## 2013-09-28 DIAGNOSIS — S7290XA Unspecified fracture of unspecified femur, initial encounter for closed fracture: Secondary | ICD-10-CM

## 2013-09-28 DIAGNOSIS — IMO0002 Reserved for concepts with insufficient information to code with codable children: Secondary | ICD-10-CM

## 2013-09-28 DIAGNOSIS — R630 Anorexia: Secondary | ICD-10-CM

## 2013-09-28 DIAGNOSIS — M1710 Unilateral primary osteoarthritis, unspecified knee: Secondary | ICD-10-CM

## 2013-10-09 ENCOUNTER — Emergency Department: Payer: Self-pay | Admitting: Emergency Medicine

## 2013-10-09 LAB — URINALYSIS, COMPLETE
BACTERIA: NONE SEEN
BILIRUBIN, UR: NEGATIVE
GLUCOSE, UR: NEGATIVE mg/dL (ref 0–75)
Ketone: NEGATIVE
Nitrite: NEGATIVE
Ph: 7 (ref 4.5–8.0)
Protein: NEGATIVE
RBC,UR: 23 /HPF (ref 0–5)
SQUAMOUS EPITHELIAL: NONE SEEN
Specific Gravity: 1.012 (ref 1.003–1.030)
WBC UR: 1012 /HPF (ref 0–5)

## 2013-10-09 LAB — COMPREHENSIVE METABOLIC PANEL
ALBUMIN: 3.1 g/dL — AB (ref 3.4–5.0)
ALT: 8 U/L — AB
AST: 21 U/L (ref 15–37)
Alkaline Phosphatase: 90 U/L
Anion Gap: 10 (ref 7–16)
BUN: 19 mg/dL — AB (ref 7–18)
Bilirubin,Total: 0.5 mg/dL (ref 0.2–1.0)
CHLORIDE: 101 mmol/L (ref 98–107)
CO2: 26 mmol/L (ref 21–32)
Calcium, Total: 8.8 mg/dL (ref 8.5–10.1)
Creatinine: 0.81 mg/dL (ref 0.60–1.30)
Glucose: 105 mg/dL — ABNORMAL HIGH (ref 65–99)
Osmolality: 276 (ref 275–301)
Potassium: 4.1 mmol/L (ref 3.5–5.1)
SODIUM: 137 mmol/L (ref 136–145)
TOTAL PROTEIN: 7.2 g/dL (ref 6.4–8.2)

## 2013-10-09 LAB — CBC WITH DIFFERENTIAL/PLATELET
Basophil #: 0.1 10*3/uL (ref 0.0–0.1)
Basophil %: 1.2 %
EOS ABS: 0.2 10*3/uL (ref 0.0–0.7)
EOS PCT: 2.8 %
HCT: 41.1 % (ref 35.0–47.0)
HGB: 12.9 g/dL (ref 12.0–16.0)
Lymphocyte #: 2.1 10*3/uL (ref 1.0–3.6)
Lymphocyte %: 29.7 %
MCH: 28.9 pg (ref 26.0–34.0)
MCHC: 31.4 g/dL — AB (ref 32.0–36.0)
MCV: 92 fL (ref 80–100)
MONO ABS: 0.5 x10 3/mm (ref 0.2–0.9)
Monocyte %: 6.4 %
NEUTROS ABS: 4.3 10*3/uL (ref 1.4–6.5)
NEUTROS PCT: 59.9 %
Platelet: 297 10*3/uL (ref 150–440)
RBC: 4.47 10*6/uL (ref 3.80–5.20)
RDW: 13 % (ref 11.5–14.5)
WBC: 7.1 10*3/uL (ref 3.6–11.0)

## 2013-10-09 LAB — LIPASE, BLOOD: LIPASE: 114 U/L (ref 73–393)

## 2013-10-09 LAB — TROPONIN I

## 2013-10-11 LAB — URINE CULTURE

## 2013-10-12 DIAGNOSIS — N390 Urinary tract infection, site not specified: Secondary | ICD-10-CM

## 2013-10-12 DIAGNOSIS — R404 Transient alteration of awareness: Secondary | ICD-10-CM

## 2013-12-01 DIAGNOSIS — G3183 Dementia with Lewy bodies: Secondary | ICD-10-CM

## 2013-12-01 DIAGNOSIS — G20 Parkinson's disease: Secondary | ICD-10-CM

## 2013-12-01 DIAGNOSIS — M150 Primary generalized (osteo)arthritis: Secondary | ICD-10-CM

## 2013-12-01 DIAGNOSIS — F39 Unspecified mood [affective] disorder: Secondary | ICD-10-CM

## 2013-12-22 ENCOUNTER — Ambulatory Visit: Payer: Federal, State, Local not specified - PPO | Admitting: Podiatrist

## 2013-12-27 ENCOUNTER — Ambulatory Visit: Payer: Federal, State, Local not specified - PPO | Admitting: Podiatry

## 2014-02-01 DIAGNOSIS — M1990 Unspecified osteoarthritis, unspecified site: Secondary | ICD-10-CM

## 2014-02-01 DIAGNOSIS — G20 Parkinson's disease: Secondary | ICD-10-CM

## 2014-02-01 DIAGNOSIS — G3183 Dementia with Lewy bodies: Secondary | ICD-10-CM

## 2014-02-01 DIAGNOSIS — F419 Anxiety disorder, unspecified: Secondary | ICD-10-CM

## 2014-03-21 DIAGNOSIS — G3183 Dementia with Lewy bodies: Secondary | ICD-10-CM | POA: Diagnosis not present

## 2014-03-21 DIAGNOSIS — F39 Unspecified mood [affective] disorder: Secondary | ICD-10-CM | POA: Diagnosis not present

## 2014-03-21 DIAGNOSIS — G20 Parkinson's disease: Secondary | ICD-10-CM | POA: Diagnosis not present

## 2014-03-21 DIAGNOSIS — M150 Primary generalized (osteo)arthritis: Secondary | ICD-10-CM | POA: Diagnosis not present

## 2014-05-18 ENCOUNTER — Emergency Department
Admit: 2014-05-18 | Disposition: A | Discharge status: Home / Self Care | Payer: Self-pay | Admitting: Emergency Medicine

## 2014-05-18 ENCOUNTER — Telehealth: Payer: Self-pay | Admitting: *Deleted

## 2014-05-18 NOTE — Telephone Encounter (Signed)
PLEASE NOTE: All timestamps contained within this report are represented as [place] Standard Time. CONFIDENTIALTY NOTICE: This fax transmission is intended only for the addressee. It contains information that is legally privileged, confidential or otherwise protected from use or disclosure. If you are not the intended recipient, you are strictly prohibited from reviewing, disclosing, copying using or disseminating any of this information or taking any action in reliance on or regarding this information. If you have received this fax in error, please notify us immediately by telephone so that we can arrange for its return to us. Phone: [phone], Toll-Free: [phone], Fax: [phone] Page: 1 of 1 Call Id: [id number] [facility] Night - Client TELEPHONE ADVICE RECORD [facility] Patient Name: Rachel CiproLILA Melone Gender: Female DOB: September 07, 1914 Age: [age] Y 1 M 6 D Return Phone Number: Address: City/State/Zip: [place] [facility] Night Physician Tillman AbideLetvak, Richard Contact Type Call Call Type Page Only Is this call to report lab results? No Return Phone Number Unavailable Initial Comment Jethro BolusCaller, Toney SangMarie w/ [facility], [phone], states she needs an order to send a resident out. She fell, has a cut on her left side of the top of her head and most likely needs stitches. Nurse Assessment Guidelines Guideline Title Affirmed Question Affirmed Notes Nurse Date/Time (Eastern Time) Disp. Time Lamount Cohen(Eastern Time) Disposition Final User 05/18/2014 2:23:48 AM Terrilee Croakalled On-Call Provider ClydeWood, Amy 05/18/2014 2:24:09 AM Page Completed Yes Lucretia RoersWood, Amy After Care Instructions Given Call Event Type User Date / Time Description Paging DoctorName Phone DateTime Result/Outcome Message Type Notes Doreen BeamYoo, Doe Hyun Robert "Rob" [phone] 05/18/2014 2:23:48 AM Called On Call Provider -  Reached Doctor Paged Artist PaisYoo, Doe Glenna FellowsHyun Robert "Rob" 05/18/2014 2:24:01 AM Spoke with On Call - General Message Result Phoned on-call. Information provided. On call transferred to caller.

## 2014-05-18 NOTE — Telephone Encounter (Signed)
She got staples in her head and [facility]. I saw her--at usual cognitive level

## 2014-05-23 DIAGNOSIS — I872 Venous insufficiency (chronic) (peripheral): Secondary | ICD-10-CM | POA: Diagnosis not present

## 2014-05-23 DIAGNOSIS — L03116 Cellulitis of left lower limb: Secondary | ICD-10-CM | POA: Diagnosis not present

## 2014-05-23 DIAGNOSIS — L03115 Cellulitis of right lower limb: Secondary | ICD-10-CM | POA: Diagnosis not present

## 2014-07-21 DIAGNOSIS — G20 Parkinson's disease: Secondary | ICD-10-CM | POA: Diagnosis not present

## 2014-07-21 DIAGNOSIS — M1990 Unspecified osteoarthritis, unspecified site: Secondary | ICD-10-CM | POA: Diagnosis not present

## 2014-07-21 DIAGNOSIS — F39 Unspecified mood [affective] disorder: Secondary | ICD-10-CM | POA: Diagnosis not present

## 2014-09-21 DIAGNOSIS — I872 Venous insufficiency (chronic) (peripheral): Secondary | ICD-10-CM | POA: Diagnosis not present

## 2014-09-21 DIAGNOSIS — G3183 Dementia with Lewy bodies: Secondary | ICD-10-CM | POA: Diagnosis not present

## 2014-09-21 DIAGNOSIS — F39 Unspecified mood [affective] disorder: Secondary | ICD-10-CM | POA: Diagnosis not present

## 2014-09-21 DIAGNOSIS — M159 Polyosteoarthritis, unspecified: Secondary | ICD-10-CM | POA: Diagnosis not present

## 2014-10-09 ENCOUNTER — Telehealth: Payer: Self-pay

## 2014-10-09 NOTE — Telephone Encounter (Signed)
She is somewhat better this AM Staff will see how she does this morning

## 2014-10-09 NOTE — Telephone Encounter (Signed)
PLEASE NOTE: All timestamps contained within this report are represented as [place] Standard Time. CONFIDENTIALTY NOTICE: This fax transmission is intended only for the addressee. It contains information that is legally privileged, confidential or otherwise protected from use or disclosure. If you are not the intended recipient, you are strictly prohibited from reviewing, disclosing, copying using or disseminating any of this information or taking any action in reliance on or regarding this information. If you have received this fax in error, please notify us immediately by telephone so that we can arrange for its return to [place]. Phone: [phone], Toll-Free: [phone], Fax: [phone] Page: 1 of 1 Call Id: [id number] [facility] Night - Client TELEPHONE ADVICE RECORD [facility] Call Center Patient Name: Rachel Faulkner Gender: Female DOB: [birth year] Age: 81 Y 5 M 26 D Return Phone Number: Address: City/State/Zip: [place] Client [facility] Night - Client Client Site [facility] - Night Physician Tillman Abide Contact Type Call Call Type Page Only Caller Name stephanie Relationship To Patient Makenize Messman Is this call to report lab results? No Return Phone Number Please choose phone number Initial Comment Caller States stephanie w/ [facility], pt is aggravated, and acting out of her normal self, yelling and screaming. urine has strong smell Facility Call Back # [phone] Nurse Assessment Guidelines Guideline Title Affirmed Question Affirmed Notes Nurse Date/Time (Eastern Time) Disp. Time Lamount Cohen Time) Disposition Final User 10/07/2014 2:26:59 PM Send to [facility] Paging Queue Salvatore Marvel 10/07/2014 2:34:49 PM Called On-Call Srihitha Tagliaferri De Hollingshead 10/07/2014 2:35:18 PM Page Completed Yes De Hollingshead After Care Instructions Given Call Event Type User Date / Time Description Paging [facility]  Phone DateTime Result/Outcome Message Type Notes Terrilee Files [phone] 10/07/2014 2:34:49 PM Called On Call Burnard Enis - Reached Doctor Paged Terrilee Files 10/07/2014 2:34:51 PM Spoke with On Call - General Message Result

## 2014-12-01 DIAGNOSIS — F39 Unspecified mood [affective] disorder: Secondary | ICD-10-CM

## 2014-12-01 DIAGNOSIS — I872 Venous insufficiency (chronic) (peripheral): Secondary | ICD-10-CM | POA: Diagnosis not present

## 2014-12-01 DIAGNOSIS — M1990 Unspecified osteoarthritis, unspecified site: Secondary | ICD-10-CM | POA: Diagnosis not present

## 2014-12-01 DIAGNOSIS — G3183 Dementia with Lewy bodies: Secondary | ICD-10-CM | POA: Diagnosis not present

## 2014-12-01 DIAGNOSIS — G20 Parkinson's disease: Secondary | ICD-10-CM | POA: Diagnosis not present

## 2014-12-07 DIAGNOSIS — L03115 Cellulitis of right lower limb: Secondary | ICD-10-CM | POA: Diagnosis not present

## 2015-01-14 ENCOUNTER — Emergency Department: Payer: Federal, State, Local not specified - PPO

## 2015-01-14 ENCOUNTER — Inpatient Hospital Stay
Admission: EM | Admit: 2015-01-14 | Discharge: 2015-01-17 | DRG: 481 | Disposition: A | Discharge status: Skilled Nursing Facility | Payer: Federal, State, Local not specified - PPO | Attending: Internal Medicine | Admitting: Internal Medicine

## 2015-01-14 ENCOUNTER — Encounter: Payer: Self-pay | Admitting: Emergency Medicine

## 2015-01-14 ENCOUNTER — Emergency Department
Admission: EM | Admit: 2015-01-14 | Discharge: 2015-01-14 | Disposition: A | Discharge status: Home / Self Care | Payer: Federal, State, Local not specified - PPO | Source: Home / Self Care | Attending: Emergency Medicine | Admitting: Emergency Medicine

## 2015-01-14 DIAGNOSIS — S7291XA Unspecified fracture of right femur, initial encounter for closed fracture: Secondary | ICD-10-CM

## 2015-01-14 DIAGNOSIS — Y929 Unspecified place or not applicable: Secondary | ICD-10-CM

## 2015-01-14 DIAGNOSIS — F419 Anxiety disorder, unspecified: Secondary | ICD-10-CM | POA: Diagnosis present

## 2015-01-14 DIAGNOSIS — M810 Age-related osteoporosis without current pathological fracture: Secondary | ICD-10-CM | POA: Diagnosis present

## 2015-01-14 DIAGNOSIS — Z87891 Personal history of nicotine dependence: Secondary | ICD-10-CM | POA: Diagnosis not present

## 2015-01-14 DIAGNOSIS — G20 Parkinson's disease: Secondary | ICD-10-CM | POA: Diagnosis present

## 2015-01-14 DIAGNOSIS — S72001A Fracture of unspecified part of neck of right femur, initial encounter for closed fracture: Secondary | ICD-10-CM | POA: Diagnosis present

## 2015-01-14 DIAGNOSIS — D62 Acute posthemorrhagic anemia: Secondary | ICD-10-CM | POA: Diagnosis not present

## 2015-01-14 DIAGNOSIS — W19XXXA Unspecified fall, initial encounter: Secondary | ICD-10-CM | POA: Diagnosis present

## 2015-01-14 DIAGNOSIS — M1990 Unspecified osteoarthritis, unspecified site: Secondary | ICD-10-CM | POA: Diagnosis present

## 2015-01-14 DIAGNOSIS — Z79899 Other long term (current) drug therapy: Secondary | ICD-10-CM | POA: Diagnosis not present

## 2015-01-14 DIAGNOSIS — S72002A Fracture of unspecified part of neck of left femur, initial encounter for closed fracture: Secondary | ICD-10-CM

## 2015-01-14 DIAGNOSIS — S72009A Fracture of unspecified part of neck of unspecified femur, initial encounter for closed fracture: Secondary | ICD-10-CM | POA: Diagnosis present

## 2015-01-14 DIAGNOSIS — S72302A Unspecified fracture of shaft of left femur, initial encounter for closed fracture: Secondary | ICD-10-CM | POA: Diagnosis present

## 2015-01-14 DIAGNOSIS — Z885 Allergy status to narcotic agent status: Secondary | ICD-10-CM

## 2015-01-14 DIAGNOSIS — S72143B Displaced intertrochanteric fracture of unspecified femur, initial encounter for open fracture type I or II: Secondary | ICD-10-CM

## 2015-01-14 DIAGNOSIS — F329 Major depressive disorder, single episode, unspecified: Secondary | ICD-10-CM | POA: Diagnosis present

## 2015-01-14 DIAGNOSIS — R52 Pain, unspecified: Secondary | ICD-10-CM

## 2015-01-14 DIAGNOSIS — K219 Gastro-esophageal reflux disease without esophagitis: Secondary | ICD-10-CM | POA: Diagnosis present

## 2015-01-14 DIAGNOSIS — S7292XA Unspecified fracture of left femur, initial encounter for closed fracture: Secondary | ICD-10-CM

## 2015-01-14 DIAGNOSIS — Z882 Allergy status to sulfonamides status: Secondary | ICD-10-CM | POA: Diagnosis not present

## 2015-01-14 DIAGNOSIS — F0280 Dementia in other diseases classified elsewhere without behavioral disturbance: Secondary | ICD-10-CM | POA: Diagnosis present

## 2015-01-14 LAB — CBC WITH DIFFERENTIAL/PLATELET
BASOS PCT: 0 %
Basophils Absolute: 0 10*3/uL (ref 0–0.1)
Eosinophils Absolute: 0 10*3/uL (ref 0–0.7)
Eosinophils Relative: 0 %
HEMATOCRIT: 27.6 % — AB (ref 35.0–47.0)
HEMOGLOBIN: 9.4 g/dL — AB (ref 12.0–16.0)
LYMPHS ABS: 1.3 10*3/uL (ref 1.0–3.6)
LYMPHS PCT: 14 %
MCH: 30.8 pg (ref 26.0–34.0)
MCHC: 34.1 g/dL (ref 32.0–36.0)
MCV: 90.3 fL (ref 80.0–100.0)
MONO ABS: 0.6 10*3/uL (ref 0.2–0.9)
MONOS PCT: 7 %
NEUTROS ABS: 7.2 10*3/uL — AB (ref 1.4–6.5)
NEUTROS PCT: 79 %
Platelets: 206 10*3/uL (ref 150–440)
RBC: 3.05 MIL/uL — ABNORMAL LOW (ref 3.80–5.20)
RDW: 13.5 % (ref 11.5–14.5)
WBC: 9.1 10*3/uL (ref 3.6–11.0)

## 2015-01-14 LAB — ABO/RH: ABO/RH(D): A NEG

## 2015-01-14 LAB — PREPARE RBC (CROSSMATCH)

## 2015-01-14 LAB — BASIC METABOLIC PANEL
Anion gap: 5 (ref 5–15)
BUN: 22 mg/dL — ABNORMAL HIGH (ref 6–20)
CALCIUM: 8.3 mg/dL — AB (ref 8.9–10.3)
CHLORIDE: 105 mmol/L (ref 101–111)
CO2: 27 mmol/L (ref 22–32)
CREATININE: 1.05 mg/dL — AB (ref 0.44–1.00)
GFR calc non Af Amer: 43 mL/min — ABNORMAL LOW (ref >60)
GFR, EST AFRICAN AMERICAN: 50 mL/min — AB (ref >60)
GLUCOSE: 140 mg/dL — AB (ref 65–99)
Potassium: 4.3 mmol/L (ref 3.5–5.1)
Sodium: 137 mmol/L (ref 135–145)

## 2015-01-14 LAB — MRSA PCR SCREENING: MRSA BY PCR: NEGATIVE

## 2015-01-14 LAB — PROTIME-INR
INR: 1.01
Prothrombin Time: 13.5 seconds (ref 11.4–15.0)

## 2015-01-14 MED ORDER — ONDANSETRON HCL 4 MG/2ML IJ SOLN
INTRAMUSCULAR | Status: AC
  Filled 2015-01-14: qty 2

## 2015-01-14 MED ORDER — ONDANSETRON HCL 4 MG/2ML IJ SOLN: 4.0000 mg | Freq: Four times a day (QID) | INTRAMUSCULAR | Status: DC | PRN

## 2015-01-14 MED ORDER — HYDROCODONE-ACETAMINOPHEN 5-325 MG PO TABS
ORAL_TABLET | ORAL | Status: AC
  Administered 2015-01-14: 1 via ORAL
  Filled 2015-01-14: qty 1

## 2015-01-14 MED ORDER — MORPHINE SULFATE (PF) 4 MG/ML IV SOLN
INTRAVENOUS | Status: AC
  Filled 2015-01-14: qty 1

## 2015-01-14 MED ORDER — HYDROCODONE-ACETAMINOPHEN 5-325 MG PO TABS
1.0000 | ORAL_TABLET | Freq: Once | ORAL | Status: AC
  Administered 2015-01-14: 1 via ORAL

## 2015-01-14 MED ORDER — ACETAMINOPHEN 325 MG PO TABS: 650.0000 mg | ORAL_TABLET | Freq: Three times a day (TID) | ORAL | Status: DC | PRN

## 2015-01-14 MED ORDER — ONDANSETRON HCL 4 MG/2ML IJ SOLN
4.0000 mg | Freq: Once | INTRAMUSCULAR | Status: AC
  Administered 2015-01-14: 4 mg via INTRAVENOUS

## 2015-01-14 MED ORDER — CEFAZOLIN SODIUM-DEXTROSE 2-3 GM-% IV SOLR
2.0000 g | Freq: Three times a day (TID) | INTRAVENOUS | Status: DC
  Administered 2015-01-14 – 2015-01-15 (×3): 2 g via INTRAVENOUS
  Filled 2015-01-14 (×4): qty 50

## 2015-01-14 MED ORDER — ONDANSETRON HCL 4 MG/2ML IJ SOLN: 4.0000 mg | Freq: Once | INTRAMUSCULAR | Status: DC

## 2015-01-14 MED ORDER — ONDANSETRON HCL 4 MG PO TABS: 4.0000 mg | ORAL_TABLET | Freq: Four times a day (QID) | ORAL | Status: DC | PRN

## 2015-01-14 MED ORDER — SERTRALINE HCL 50 MG PO TABS
25.0000 mg | ORAL_TABLET | Freq: Every day | ORAL | Status: DC
  Administered 2015-01-14 – 2015-01-16 (×3): 25 mg via ORAL
  Filled 2015-01-14 (×3): qty 1

## 2015-01-14 MED ORDER — MORPHINE SULFATE (PF) 2 MG/ML IV SOLN
2.0000 mg | INTRAVENOUS | Status: DC | PRN
  Administered 2015-01-14 – 2015-01-15 (×2): 2 mg via INTRAVENOUS
  Filled 2015-01-14 (×2): qty 1

## 2015-01-14 MED ORDER — PRAMIPEXOLE DIHYDROCHLORIDE 0.25 MG PO TABS
0.2500 mg | ORAL_TABLET | Freq: Three times a day (TID) | ORAL | Status: DC
  Administered 2015-01-14 – 2015-01-17 (×7): 0.25 mg via ORAL
  Filled 2015-01-14 (×7): qty 1

## 2015-01-14 MED ORDER — HYDROCODONE-ACETAMINOPHEN 5-325 MG PO TABS: 1.0000 | ORAL_TABLET | ORAL | Status: DC | PRN

## 2015-01-14 MED ORDER — MORPHINE SULFATE (PF) 4 MG/ML IV SOLN
4.0000 mg | Freq: Once | INTRAVENOUS | Status: AC
  Administered 2015-01-14: 4 mg via INTRAVENOUS

## 2015-01-14 MED ORDER — DOCUSATE SODIUM 100 MG PO CAPS
100.0000 mg | ORAL_CAPSULE | Freq: Two times a day (BID) | ORAL | Status: DC
Start: 2015-01-14 — End: 2015-01-15
  Administered 2015-01-14: 100 mg via ORAL
  Filled 2015-01-14: qty 1

## 2015-01-14 MED ORDER — ALBUTEROL SULFATE HFA 108 (90 BASE) MCG/ACT IN AERS: 2.0000 | INHALATION_SPRAY | RESPIRATORY_TRACT | Status: DC | PRN

## 2015-01-14 MED ORDER — MORPHINE SULFATE (PF) 2 MG/ML IV SOLN
INTRAVENOUS | Status: AC
  Filled 2015-01-14: qty 1

## 2015-01-14 MED ORDER — NITROGLYCERIN 2 % TD OINT
TOPICAL_OINTMENT | TRANSDERMAL | Status: AC
  Filled 2015-01-14: qty 1

## 2015-01-14 MED ORDER — CARBIDOPA-LEVODOPA 25-100 MG PO TABS
1.0000 | ORAL_TABLET | Freq: Four times a day (QID) | ORAL | Status: DC
  Administered 2015-01-14 – 2015-01-17 (×9): 1 via ORAL
  Filled 2015-01-14 (×10): qty 1

## 2015-01-14 MED ORDER — MORPHINE SULFATE (PF) 2 MG/ML IV SOLN
2.0000 mg | Freq: Once | INTRAVENOUS | Status: AC
  Administered 2015-01-14: 2 mg via INTRAVENOUS

## 2015-01-14 MED ORDER — DIAZEPAM 2 MG PO TABS
2.0000 mg | ORAL_TABLET | Freq: Once | ORAL | Status: AC
  Administered 2015-01-14: 2 mg via ORAL
  Filled 2015-01-14: qty 1

## 2015-01-14 MED ORDER — MORPHINE SULFATE (PF) 2 MG/ML IV SOLN: 2.0000 mg | Freq: Once | INTRAVENOUS | Status: DC

## 2015-01-14 MED ORDER — ALBUTEROL SULFATE (2.5 MG/3ML) 0.083% IN NEBU: 2.5000 mg | INHALATION_SOLUTION | RESPIRATORY_TRACT | Status: DC | PRN

## 2015-01-14 MED ORDER — LACTATED RINGERS IV SOLN
INTRAVENOUS | Status: DC
  Administered 2015-01-14 – 2015-01-15 (×2): via INTRAVENOUS

## 2015-01-14 MED ORDER — PANTOPRAZOLE SODIUM 40 MG IV SOLR
40.0000 mg | INTRAVENOUS | Status: DC
  Administered 2015-01-14 – 2015-01-16 (×3): 40 mg via INTRAVENOUS
  Filled 2015-01-14 (×3): qty 40

## 2015-01-14 NOTE — ED Provider Notes (Signed)
[facility] Emergency Department Provider Note   ____________________________________________  Time seen: 1530  I have reviewed the triage vital signs and the nursing notes.   HISTORY  Chief Complaint Fall   History limited by: Dementia   HPI Rachel Faulkner is a 81 y.o. female who presents to the emergency department today via EMS. Patient unfortunately is unable to give any history. The patient was seen in the emergency department last night because of a fall. The patient broke both of her femurs at that time. Per documentation the doctor that saw her last night discussed with family about plans of care. They did not want surgery. Orthopedics was consulted. The plan was to send the patient home with symptomatic treatment. It appears that the nursing home was unable to control the patient's pain sufficiently.    Past Medical History  Diagnosis Date  . Dementia without behavioral disturbance   . Parkinson disease (HCC)   . Anxiety   . Depression   . Arthritis   . Osteoporosis   . Urinary incontinence     Patient Active Problem List   Diagnosis Date Noted  . DEMENTIA 10/26/2006  . ANXIETY 10/26/2006  . DEPRESSION 10/26/2006  . PARKINSON'S DISEASE 10/26/2006  . OSTEOARTHRITIS 10/26/2006  . OSTEOPOROSIS 10/26/2006  . URINARY INCONTINENCE 10/26/2006    Past Surgical History  Procedure Laterality Date  . Appendectomy    . Tonsillectomy    . Colonoscopy w/ polypectomy  ?[id number]    Current Outpatient Rx  Name  Route  Sig  Dispense  Refill  . acetaminophen (TYLENOL ARTHRITIS PAIN) 650 MG CR tablet   Oral   Take 650 mg by mouth every 12 (twelve) hours.         Marland Kitchen. albuterol (PROVENTIL HFA;VENTOLIN HFA) 108 (90 BASE) MCG/ACT inhaler   Inhalation   Inhale 2 puffs into the lungs every 4 (four) hours as needed for wheezing or shortness of breath.         Marland Kitchen. alum & mag hydroxide-simeth (MAALOX/MYLANTA) 200-200-20 MG/5ML suspension   Oral  Take 30 mLs by mouth every 4 (four) hours as needed for indigestion, heartburn or flatulence.         Marland Kitchen. aspirin 81 MG chewable tablet   Oral   Chew 81 mg by mouth daily.         . carbidopa-levodopa (SINEMET) 25-100 MG per tablet   Oral   Take 1 tablet by mouth 4 (four) times daily.           . ergocalciferol (VITAMIN D2) 50000 UNITS capsule   Oral   Take 50,000 Units by mouth every 30 (thirty) days.           . Foot Care Products (CORN CUSHIONS) PADS      USE AS DIRECTED PER ORDER ON TREATMENT SHEET. Patient not taking: Reported on 01/14/2015   18 each   11   . furosemide (LASIX) 20 MG tablet   Oral   Take 20 mg by mouth.         Marland Kitchen. HYDROcodone-acetaminophen (NORCO/VICODIN) 5-325 MG per tablet   Oral   Take 1 tablet by mouth every 4 (four) hours as needed (pain).   30 tablet      . loratadine (CLARITIN) 10 MG tablet   Oral   Take 10 mg by mouth daily.         . Multiple Vitamins-Minerals (CENTRUM SILVER PO)   Oral   Take 1 tablet by mouth daily.           .Marland Kitchen  multivitamin-lutein (OCUVITE-LUTEIN) CAPS   Oral   Take 2 capsules by mouth 2 (two) times daily.           . pramipexole (MIRAPEX) 0.25 MG tablet   Oral   Take 0.25 mg by mouth 3 (three) times daily.           . sertraline (ZOLOFT) 25 MG tablet   Oral   Take 25 mg by mouth daily.           Allergies Codeine and Sulfa antibiotics  Family History  Problem Relation Age of Onset  . Diabetes Neg Hx   . Hypertension Neg Hx   . Breast cancer Neg Hx   . Colon cancer Neg Hx     Social History Social History  Substance Use Topics  . Smoking status: Former Smoker -- 2 years  . Smokeless tobacco: Former Neurosurgeon  . Alcohol Use: No     Comment: did drink a little in distant past    Review of Systems Unable to obtain secondary to dementia  ____________________________________________   PHYSICAL EXAM:  VITAL SIGNS: ED Triage Vitals  Enc Vitals Group     BP 01/14/15 1532 148/109  mmHg     Pulse Rate 01/14/15 1532 90     Resp 01/14/15 1532 18     Temp 01/14/15 1532 97.8 F (36.6 C)     Temp Source 01/14/15 1532 Oral     SpO2 01/14/15 1532 96 %   Constitutional: Awake and alert, appears uncomfortable. Eyes: Conjunctivae are normal. PERRL. Normal extraocular movements. ENT   Head: Normocephalic and atraumatic.   Nose: No congestion/rhinnorhea.   Mouth/Throat: Mucous membranes are moist.   Neck: No stridor. Hematological/Lymphatic/Immunilogical: No cervical lymphadenopathy. Cardiovascular: Normal rate, regular rhythm.  No murmurs, rubs, or gallops. Respiratory: Normal respiratory effort without tachypnea nor retractions. Breath sounds are clear and equal bilaterally. No wheezes/rales/rhonchi. Gastrointestinal: Soft and nontender. No distention.  Genitourinary: Deferred Musculoskeletal: Very tender to palpation and manipulation of bilateral hips. Neurologic:  Normal speech and language. No gross focal neurologic deficits are appreciated.  Skin:  Skin is warm, dry and intact. No rash noted. Psychiatric: Mood and affect are normal. Speech and behavior are normal. Patient exhibits appropriate insight and judgment.  ____________________________________________    LABS (pertinent positives/negatives)  Labs Reviewed  CBC WITH DIFFERENTIAL/PLATELET - Abnormal; Notable for the following:    RBC 3.05 (*)    Hemoglobin 9.4 (*)    HCT 27.6 (*)    Neutro Abs 7.2 (*)    All other components within normal limits  BASIC METABOLIC PANEL - Abnormal; Notable for the following:    Glucose, Bld 140 (*)    BUN 22 (*)    Creatinine, Ser 1.05 (*)    Calcium 8.3 (*)    GFR calc non Af Amer 43 (*)    GFR calc Af Amer 50 (*)    All other components within normal limits  PROTIME-INR  TYPE AND SCREEN  PREPARE RBC (CROSSMATCH)  ABO/RH    ____________________________________________    RADIOLOGY  Femur Right  IMPRESSION: Oblique displaced fracture of the  mid to distal right femoral shaft with medial and anterior displacement and mild overriding of the fracture fragments.  Status post intra medullary rod and screw fixation of the right femur.  Osteopenia.   ____________________________________________   PROCEDURES  Procedure(s) performed: None  Critical Care performed: No  ____________________________________________   INITIAL IMPRESSION / ASSESSMENT AND PLAN / ED COURSE  Pertinent labs & imaging  results that were available during my care of the patient were reviewed by me and considered in my medical decision making (see chart for details).  Patient re-presented to the emergency department today because of continued hip pain. Patient was seen last night diagnosed bilateral hip fractures. On discussion with the family and they would be interested in any surgical option. Orthopedics was called and evaluated the patient. Will plan on admission for operative fixation of the left hip.  ____________________________________________   FINAL CLINICAL IMPRESSION(S) / ED DIAGNOSES  Final diagnoses:  Hip fracture, unspecified laterality, closed, initial encounter (HCC)     Phineas Semen, MD 01/14/15 1958

## 2015-01-14 NOTE — ED Notes (Signed)
Report to janie, rn.  

## 2015-01-14 NOTE — Progress Notes (Signed)
Pt. Is confused with dementia. Unable to answer questions. Admission incomplete.

## 2015-01-14 NOTE — ED Notes (Signed)
Pt returns to ED via EMS w/ c/o leg pain.  Per EMS, pt was DC this AM after fall.  Per EMS, there was miscommunication btw family and MD, t sent back because facility unable to control pts pain.  MD Lorde looked at previous scan, MD Good man notified.

## 2015-01-14 NOTE — ED Notes (Signed)
Pt resting quietly, resps unlabored.

## 2015-01-14 NOTE — ED Notes (Signed)
Pt from [facility] with unwitnessed fall. Pt complains of pain all over, pt yelling, alert to self only. 2+ radial and pulses noted. Bilateral pedal and tibial edema 2+ noted, left anterior tibial redness noted.

## 2015-01-14 NOTE — H&P (Signed)
[facility] - [place] at [facility]   PATIENT NAME: Rachel Faulkner    MR#:  [id number]  DATE OF BIRTH:  07/06/1914  DATE OF ADMISSION:  01/14/2015  PRIMARY CARE PHYSICIAN: Tillman Abide, MD   REQUESTING/REFERRING PHYSICIAN: Dr. Derrill Kay  CHIEF COMPLAINT:  Fall  HISTORY OF PRESENT ILLNESS:  Rachel Faulkner  is a 81 y.o. female with a known history of dementia, Parkinson's disease, anxiety and osteoporosis is brought into the ED by EMS after she sustained an unwitnessed fall at nursing home. Patient was brought in today morning to the ED and was evaluated by the ED physician and diagnosed with closed left femur fracture. After talking to the on-call orthopedics doctor , patient's knee was immobilized in a immobilizer and was sent back to the nursing home. However patient was sent back to nursing home as the pain was intolerable. Hip x-ray was done on the right hip also and patient was noticed to have bilateral hip fractures. After discussing the risks and benefits of surgery as patient being elderly son has decided to proceed with the surgery for his mom. Dr. Lavone Neri scheduling her for left hip surgery in a.m. patient being demented is a poor historian. According to the son patient does not have any heart conditions or heart attacks in the past. Other than Parkinson's dementia she is healthy at her baseline  PAST MEDICAL HISTORY:   Past Medical History  Diagnosis Date  . Dementia without behavioral disturbance   . Parkinson disease (HCC)   . Anxiety   . Depression   . Arthritis   . Osteoporosis   . Urinary incontinence     PAST SURGICAL HISTOIRY:   Past Surgical History  Procedure Laterality Date  . Appendectomy    . Tonsillectomy    . Colonoscopy w/ polypectomy  ?1980's    SOCIAL HISTORY:   Social History  Substance Use Topics  . Smoking status: Former Smoker -- 2 years  . Smokeless tobacco: Former Neurosurgeon  . Alcohol Use: No     Comment: did drink  a little in distant past    FAMILY HISTORY:   Family History  Problem Relation Age of Onset  . Diabetes Neg Hx   . Hypertension Neg Hx   . Breast cancer Neg Hx   . Colon cancer Neg Hx     DRUG ALLERGIES:   Allergies  Allergen Reactions  . Codeine Other (See Comments)    Reaction:  Unknown   . Sulfa Antibiotics Other (See Comments)    Reaction:  Unknown     REVIEW OF SYSTEMS:  Review of systems unobtainable as the patient is with dementia  MEDICATIONS AT HOME:   Prior to Admission medications   Medication Sig Start Date End Date Taking? Authorizing Provider  acetaminophen (TYLENOL ARTHRITIS PAIN) 650 MG CR tablet Take 650 mg by mouth 2 (two) times daily.    Yes Historical Provider, MD  acetaminophen (TYLENOL) 325 MG tablet Take 650 mg by mouth 3 (three) times daily as needed for mild pain.   Yes Historical Provider, MD  albuterol (PROVENTIL HFA;VENTOLIN HFA) 108 (90 BASE) MCG/ACT inhaler Inhale 2 puffs into the lungs every 4 (four) hours as needed for wheezing or shortness of breath.   Yes Historical Provider, MD  alum & mag hydroxide-simeth (MAALOX/MYLANTA) 200-200-20 MG/5ML suspension Take 30 mLs by mouth every 4 (four) hours as needed for indigestion, heartburn or flatulence.   Yes Historical Provider, MD  aspirin 81 MG chewable tablet Chew  81 mg by mouth daily.   Yes Historical Provider, MD  carbidopa-levodopa (SINEMET) 25-100 MG per tablet Take 1 tablet by mouth 4 (four) times daily.     Yes Historical Provider, MD  furosemide (LASIX) 20 MG tablet Take 20 mg by mouth daily.    Yes Historical Provider, MD  HYDROcodone-acetaminophen (NORCO/VICODIN) 5-325 MG per tablet Take 1 tablet by mouth every 4 (four) hours as needed (pain). Patient taking differently: Take 1 tablet by mouth every 4 (four) hours as needed for moderate pain.  06/23/13  Yes Karie Schwalbe, MD  loratadine (CLARITIN) 10 MG tablet Take 10 mg by mouth daily.   Yes Historical Provider, MD  Multiple Vitamin  (MULTIVITAMIN WITH MINERALS) TABS tablet Take 1 tablet by mouth at bedtime.   Yes Historical Provider, MD  Multiple Vitamins-Minerals (PRESERVISION AREDS 2 PO) Take 1 tablet by mouth daily at 12 noon.   Yes Historical Provider, MD  pramipexole (MIRAPEX) 0.25 MG tablet Take 0.25 mg by mouth 3 (three) times daily.     Yes Historical Provider, MD  sertraline (ZOLOFT) 25 MG tablet Take 25 mg by mouth at bedtime.    Yes Historical Provider, MD  Vitamin D, Ergocalciferol, (DRISDOL) 50000 UNITS CAPS capsule Take 50,000 Units by mouth every 30 (thirty) days. Pt takes on the 24th of every month.   Yes Historical Provider, MD      VITAL SIGNS:  Blood pressure 97/45, pulse 95, temperature 97.8 F (36.6 C), temperature source Oral, resp. rate 20, SpO2 95 %.  PHYSICAL EXAMINATION:  GENERAL:  81 y.o.-year-old patient lying in the bed with no acute distress.  EYES: Pupils equal, round, reactive to light and accommodation. No scleral icterus.  HEENT: Head atraumatic, normocephalic. Oropharynx and nasopharynx clear.  NECK:  Supple, no jugular venous distention. No thyroid enlargement, no tenderness.  LUNGS: Normal breath sounds bilaterally, no wheezing, rales,rhonchi or crepitation. No use of accessory muscles of respiration.  CARDIOVASCULAR: S1, S2 normal. No murmurs, rubs, or gallops.  ABDOMEN: Soft, nontender, nondistended. Bowel sounds present. No organomegaly or mass.  EXTREMITIES: Left lower extremity in an immobilizer .right hip area is externally rotated and tender. No pedal edema, cyanosis, or clubbing.  NEUROLOGIC: Patient is alert but demented  PSYCHIATRIC: The patient is alert , demented and poor historian SKIN: No obvious rash, lesion, or ulcer.   LABORATORY PANEL:   CBC  Recent Labs Lab 01/14/15 1557  WBC 9.1  HGB 9.4*  HCT 27.6*  PLT 206   ------------------------------------------------------------------------------------------------------------------  Chemistries   Recent  Labs Lab 01/14/15 1557  NA 137  K 4.3  CL 105  CO2 27  GLUCOSE 140*  BUN 22*  CREATININE 1.05*  CALCIUM 8.3*   ------------------------------------------------------------------------------------------------------------------  Cardiac Enzymes No results for input(s): TROPONINI in the last 168 hours. ------------------------------------------------------------------------------------------------------------------  RADIOLOGY:  Dg Chest 1 View  01/14/2015  CLINICAL DATA:  Poor preop.  Status post unwitnessed fall. EXAM: CHEST 1 VIEW COMPARISON:  September 06, 2013 FINDINGS: The heart size and mediastinal contours are stable. The heart size is enlarged. There are calcified granulomas in the left lateral mid lung unchanged. Mild chronic diffuse increased pulmonary interstitium are unchanged. There is no focal pneumonia or pleural effusion. The visualized skeletal structures are stable. Prior cholecystectomy clips are noted. IMPRESSION: No active cardiopulmonary disease. Electronically Signed   By: Sherian Rein M.D.   On: 01/14/2015 07:23   Ct Head Wo Contrast  01/14/2015  CLINICAL DATA:  Status post unwitnessed fall, with headache. Initial encounter.  EXAM: CT HEAD WITHOUT CONTRAST TECHNIQUE: Contiguous axial images were obtained from the base of the skull through the vertex without intravenous contrast. COMPARISON:  CT of the head performed 05/18/2014 FINDINGS: There is no evidence of acute infarction, mass lesion, or intra- or extra-axial hemorrhage on CT. Prominence of ventricles and sulci reflects mild to moderate cortical volume loss. Mild cerebellar atrophy is noted. Scattered periventricular and subcortical white matter change likely reflects small vessel ischemic microangiopathy. A chronic infarct is noted at the right external capsule. The brainstem and fourth ventricle are within normal limits. The cerebral hemispheres demonstrate grossly normal gray-white differentiation. No mass effect  or midline shift is seen. There is no evidence of fracture; visualized osseous structures are unremarkable in appearance. The orbits are within normal limits. The paranasal sinuses and mastoid air cells are well-aerated. No significant soft tissue abnormalities are seen. IMPRESSION: 1. No acute intracranial pathology seen on CT. 2. Mild to moderate cortical volume loss and scattered small vessel ischemic microangiopathy. 3. 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 Electronically Signed   By: Roanna RaiderJeffery  Chang M.D.   On: 01/14/2015 06:15   Dg Knee Complete 4 Views Right  01/14/2015  CLINICAL DATA:  Acute onset of right knee swelling and pain, status post unwitnessed fall. Initial encounter. EXAM: RIGHT KNEE - COMPLETE 4+ VIEW COMPARISON:  None. FINDINGS: There is a displaced oblique fracture across the left femoral diaphysis. The joint spaces are preserved. Mild marginal osteophytes are noted at the medial and lateral compartments. The intramedullary rod is grossly unremarkable in appearance, though the fracture is thought to extend to the intramedullary rod. No significant joint effusion is seen. Scattered vascular calcifications are seen. IMPRESSION: Displaced oblique fracture across the left femoral diaphysis, thought to extend to the intramedullary rod. Electronically Signed   By: Roanna RaiderJeffery  Chang M.D.   On: 01/14/2015 06:56   Dg Hip Unilat With Pelvis 2-3 Views Right  01/14/2015  CLINICAL DATA:  Status post unwitnessed fall, with right lower leg swelling. Initial encounter. EXAM: DG HIP (WITH OR WITHOUT PELVIS) 2-3V RIGHT COMPARISON:  None. FINDINGS: There is a displaced fracture at the proximal to mid diaphysis of the right femur, with medial and anterior displacement and shortening. This extends to the patient's intramedullary rod. The femoral heads remain seated normally within the respective acetabula. The sacroiliac joints are grossly unremarkable. Scattered vascular calcifications are seen. The  visualized bowel gas pattern is grossly unremarkable. IMPRESSION: Displaced fracture at the proximal to mid diaphysis of the right femur, with medial and anterior displacement and shortening. This extends to the intramedullary rod. Electronically Signed   By: Roanna RaiderJeffery  Chang M.D.   On: 01/14/2015 06:27   Dg Femur Min 2 Views Left  01/14/2015  CLINICAL DATA:  Left leg pain.  Unwitnessed fall. EXAM: LEFT FEMUR 2 VIEWS COMPARISON:  None. FINDINGS: The bones appear diffusely osteopenic. There is an acute, oblique fracture deformity involving the proximal femur. There is medial angulation of the distal fracture fragments. Vascular calcifications are noted. IMPRESSION: 1. Acute, oblique fracture involves the proximal left femur. Electronically Signed   By: Signa Kellaylor  Stroud M.D.   On: 01/14/2015 07:21   Dg Femur, Min 2 Views Right  01/14/2015  CLINICAL DATA:  Bilateral femur pain status post unwitnessed fall. EXAM: RIGHT FEMUR 2 VIEWS COMPARISON:  None. FINDINGS: There is osteopenia. There is a displaced oblique fracture through the mid and distal right femoral shaft with medial and anterior displacement the fracture line extends to the distal portion  of the intra medullary rod. Patient is status post intra medullary rod and screw fixation. There is no evidence of intra-articular extension. No evidence of right knee joint effusion. Vascular calcifications are noted. IMPRESSION: Oblique displaced fracture of the mid to distal right femoral shaft with medial and anterior displacement and mild overriding of the fracture fragments. Status post intra medullary rod and screw fixation of the right femur. Osteopenia. Electronically Signed   By: Ted Mcalpine M.D.   On: 01/14/2015 17:09    EKG:   Orders placed or performed during the hospital encounter of 01/14/15  . EKG 12-Lead  . EKG 12-Lead    IMPRESSION AND PLAN:   1. Bilateral hip fractures with intractable pain Patient is scheduled for surgery of the  left hip in a.m.. Orthopedics is not considering right hip surgery at this time  Pain management as needed with bowel regimen I had an extensive discussion with the patient's son, as patient is at high risk for noncardiac surgery from anesthesia standpoint and patient being elderly at age 39 . He understands the risks and benefits of proceeding with the surgery and agreeable with the surgery at this time. Patient is medically cleared from medical standpoint  2. History of Parkinson's disease Continue Sinemet  3. History of anxiety and depression Resume Zoloft  4. Chronic history of dementia Currently patient is stable with no behavioral disturbances  Provide GI prophylaxis with Protonix and DVT prophylaxis with teds  Patient is medically cleared for surgery  All the records are reviewed and case discussed with ED provider. Management plans discussed with the patient's son who is the healthcare power of attorney and he is in agreement.  CODE STATUS: DO NOT RESUSCITATE, son is the healthcare power of attorney  TOTAL TIME TAKING CARE OF THIS PATIENT: 45 minutes.    Ramonita Lab M.D on 01/14/2015 at 8:05 PM  Between 7am to 6pm - Pager - [phone]  After 6pm go to [email or web address] - password [email or web address] [facility]  [facility]  Office  [phone]  CC: Primary care physician; Tillman Abide, MD

## 2015-01-14 NOTE — ED Notes (Signed)
Pt returned from xray and ct scan. Pt not returned to cardiac monitor leads, pt agitated when leads placed. Pt calm without leads. Pt states pain improved, but is unable to quantify number.

## 2015-01-14 NOTE — ED Provider Notes (Signed)
[facility] Emergency Department Provider Note  ____________________________________________  Time seen: Approximately 5:23 AM  I have reviewed the triage vital signs and the nursing notes.   HISTORY  Chief Complaint Fall  Limited by dementia  HPI Rachel Faulkner is a 81 y.o. female who presents to the ED via EMS from nursing home with unwitnessed fall. Staff state patient was found on the floor complaining of pain all over, mostly to right hip and knee. Staff state patient's baseline is demented with screaming. Patient thinks she tripped and lost her balance. Does not recall striking head or LOC. Denies fever, chills, chest pain, shortness of breath, abdominal pain, nausea, vomiting, diarrhea, dysuria. Complains of right hip and knee pain.   Past Medical History  Diagnosis Date  . Dementia without behavioral disturbance   . Parkinson disease (HCC)   . Anxiety   . Depression   . Arthritis   . Osteoporosis   . Urinary incontinence     Patient Active Problem List   Diagnosis Date Noted  . DEMENTIA 10/26/2006  . ANXIETY 10/26/2006  . DEPRESSION 10/26/2006  . PARKINSON'S DISEASE 10/26/2006  . OSTEOARTHRITIS 10/26/2006  . OSTEOPOROSIS 10/26/2006  . URINARY INCONTINENCE 10/26/2006    Past Surgical History  Procedure Laterality Date  . Appendectomy    . Tonsillectomy    . Colonoscopy w/ polypectomy  ?1027'O    Current Outpatient Rx  Name  Route  Sig  Dispense  Refill  . acetaminophen (TYLENOL ARTHRITIS PAIN) 650 MG CR tablet   Oral   Take 650 mg by mouth every 12 (twelve) hours.         Marland Kitchen albuterol (PROVENTIL HFA;VENTOLIN HFA) 108 (90 BASE) MCG/ACT inhaler   Inhalation   Inhale 2 puffs into the lungs every 4 (four) hours as needed for wheezing or shortness of breath.         Marland Kitchen alum & mag hydroxide-simeth (MAALOX/MYLANTA) 200-200-20 MG/5ML suspension   Oral   Take 30 mLs by mouth every 4 (four) hours as needed for indigestion, heartburn  or flatulence.         Marland Kitchen aspirin 81 MG chewable tablet   Oral   Chew 81 mg by mouth daily.         . carbidopa-levodopa (SINEMET) 25-100 MG per tablet   Oral   Take 1 tablet by mouth 4 (four) times daily.           . ergocalciferol (VITAMIN D2) 50000 UNITS capsule   Oral   Take 50,000 Units by mouth every 30 (thirty) days.           . furosemide (LASIX) 20 MG tablet   Oral   Take 20 mg by mouth.         Marland Kitchen HYDROcodone-acetaminophen (NORCO/VICODIN) 5-325 MG per tablet   Oral   Take 1 tablet by mouth every 4 (four) hours as needed (pain).   30 tablet      . loratadine (CLARITIN) 10 MG tablet   Oral   Take 10 mg by mouth daily.         . Multiple Vitamins-Minerals (CENTRUM SILVER PO)   Oral   Take 1 tablet by mouth daily.           . multivitamin-lutein (OCUVITE-LUTEIN) CAPS   Oral   Take 2 capsules by mouth 2 (two) times daily.           . pramipexole (MIRAPEX) 0.25 MG tablet   Oral   Take  0.25 mg by mouth 3 (three) times daily.           . sertraline (ZOLOFT) 25 MG tablet   Oral   Take 25 mg by mouth daily.         . Foot Care Products (CORN CUSHIONS) PADS      USE AS DIRECTED PER ORDER ON TREATMENT SHEET. Patient not taking: Reported on 01/14/2015   18 each   11     Allergies Codeine and Sulfa antibiotics  Family History  Problem Relation Age of Onset  . Diabetes Neg Hx   . Hypertension Neg Hx   . Breast cancer Neg Hx   . Colon cancer Neg Hx     Social History Social History  Substance Use Topics  . Smoking status: Former Smoker -- 2 years  . Smokeless tobacco: Former Neurosurgeon  . Alcohol Use: No     Comment: did drink a little in distant past    Review of Systems Constitutional: No fever/chills Eyes: No visual changes. ENT: No sore throat. Cardiovascular: Denies chest pain. Respiratory: Denies shortness of breath. Gastrointestinal: No abdominal pain.  No nausea, no vomiting.  No diarrhea.  No constipation. Genitourinary:  Negative for dysuria. Musculoskeletal: Positive for right hip and knee pain. Negative for back pain. Skin: Negative for rash. Neurological: Negative for headaches, focal weakness or numbness.  10-point ROS otherwise negative.  ____________________________________________   PHYSICAL EXAM:  VITAL SIGNS: ED Triage Vitals  Enc Vitals Group     BP 01/14/15 0452 120/94 mmHg     Pulse Rate 01/14/15 0452 87     Resp 01/14/15 0452 20     Temp 01/14/15 0452 98.3 F (36.8 C)     Temp Source 01/14/15 0452 Oral     SpO2 01/14/15 0452 100 %     Weight 01/14/15 0452 135 lb 1 oz (61.264 kg)     Height --      Head Cir --      Peak Flow --      Pain Score --      Pain Loc --      Pain Edu? --      Excl. in GC? --     Constitutional: Alert and oriented. Well appearing and in mild acute distress. Eyes: Conjunctivae are normal. PERRL. EOMI. Head: Atraumatic. Nose: No congestion/rhinnorhea. Mouth/Throat: Mucous membranes are moist.  Oropharynx non-erythematous. Neck: No stridor.  No cervical spine tenderness to palpation.  No step-offs or deformities. Cardiovascular: Normal rate, regular rhythm. Grossly normal heart sounds.  Good peripheral circulation. Respiratory: Normal respiratory effort.  No retractions. Lungs CTAB. Gastrointestinal: Soft and nontender. No distention. No abdominal bruits. No CVA tenderness. Musculoskeletal: Pelvis stable. Right hip tender to palpation; limited range of motion secondary to pain. Right knee painful to palpation. BLE 1+ pedal edema. 2+ distal pulses.  Neurologic:  Alert and oriented to person only. Screams when you touch her. Screams when she is left alone. Normal speech and language. No gross focal neurologic deficits are appreciated.  Skin:  Skin is warm, dry and intact. No rash noted. Psychiatric: Mood and affect are normal. Speech and behavior are normal.  ____________________________________________   LABS (all labs ordered are listed, but only  abnormal results are displayed)  Labs Reviewed - No data to display ____________________________________________  EKG  ED ECG REPORT I, Litisha Guagliardo J, the attending physician, personally viewed and interpreted this ECG.   Date: 01/14/2015  EKG Time: 0456  Rate: 92  Rhythm: normal EKG,  normal sinus rhythm  Axis: Normal  Intervals:none  ST&T Change: Nonspecific  ____________________________________________  RADIOLOGY  CT Head interpreted per Dr. Cherly Hensenhang: 1. No acute intracranial pathology seen on CT. 2. Mild to moderate cortical volume loss and scattered small vessel ischemic microangiopathy. 3. 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  Right hip xrays (viewed by me, interpreted per Dr. Cherly Hensenhang): Displaced fracture at the proximal to mid diaphysis of the right femur, with medial and anterior displacement and shortening. This extends to the intramedullary rod.  Right knee xrays (viewed by me, interpreted per Dr. Cherly Hensenhang): Displaced oblique fracture across the left femoral diaphysis, thought to extend to the intramedullary rod.  Left Femur xrays (viewed by me, interpreted per Dr. Bradly ChrisStroud): 1. Acute, oblique fracture involves the proximal left femur.  Chest 1 view (viewed by me, interpreted per Dr. Juel BurrowLin): No active cardiopulmonary disease. ____________________________________________   PROCEDURES  Procedure(s) performed: None  Critical Care performed: No  ____________________________________________   INITIAL IMPRESSION / ASSESSMENT AND PLAN / ED COURSE  Pertinent labs & imaging results that were available during my care of the patient were reviewed by me and considered in my medical decision making (see chart for details).  81 year old female s/p unwitnessed fall with right hip and knee pain. Will obtain x-ray imaging studies as well as CT head given patient's dementia.  ----------------------------------------- 6:41 AM on  01/14/2015 -----------------------------------------  Patient sent for left femur x-rays as I visualize left femur fracture seen on pelvis film. I spoke with patient's son, Jonny RuizJohn at [phone] who confirms that patient has been nonambulatory for the past 5 years. He would not desire surgery. I discussed with him I will speak with orthopedics who will likely manage patient nonoperatively as she is nonambulatory. Spoke with orthopedics (Dr. Jimmey RalphParker) who will review images and give recommendations for management.  [phone]  Dr. Jimmey RalphParker recommended gentle traction with knee immobilizer and having facility observe patient's skin for erosion. Updated patient and given strict return precautions. Patient verbalizes understanding and agrees with plan of care. ____________________________________________   FINAL CLINICAL IMPRESSION(S) / ED DIAGNOSES  Final diagnoses:  Fall, initial encounter  Closed fracture of shaft of left femur, unspecified fracture morphology, initial encounter (HCC)  Femur fracture, right, closed, initial encounter      Irean HongJade J Greenleigh Kauth, MD 01/14/15 [phone]

## 2015-01-14 NOTE — Consult Note (Addendum)
ORTHOPAEDIC CONSULTATION  PATIENT NAME: Rachel PrudeLila V Chauvin DOB: Jan 14, 1915  MRN: [id number]  REQUESTING PHYSICIAN: Phineas SemenGraydon Goodman, MD  Chief Complaint: Bilateral Lower Extremity Injuries  HPI: Rachel Faulkner is a 81 y.o. female who complains of  lateral lower extremity pain after a fall in her skilled nursing facility.  She is a patient with past medical history of dementia and Parkinson's disease as well as severe anxiety. She presents with bilateral lower extremity fractures. She had a gamma nail placed several years ago on the right side that provided her with some relief but ever since that surgery she has been nonambulatory. She spends most of her day in a wheelchair and is doing fully assisted transfers at her skilled nursing facility.  She presented to the emergency department on 01/14/2015 in the early morning in the emergency physician discussed surgery with the patient's family. At this time the patient did not want to proceed with surgery. After she was readmitted to the skilled nursing facility she had continued pain and the family discussed the situation further with the nursing staff at the skilled nursing facility. They returned here to revisit the discussion of surgery. She is complaining of bilateral lower extremity pain. The pain comes in waves, and she points to both lower extremities at this time. She denies any history of blood clots or deep infections.  He is here today with her son and her daughter-in-law.    Past Medical History  Diagnosis Date  . Dementia without behavioral disturbance   . Parkinson disease (HCC)   . Anxiety   . Depression   . Arthritis   . Osteoporosis   . Urinary incontinence    Past Surgical History  Procedure Laterality Date  . Appendectomy    . Tonsillectomy    . Colonoscopy w/ polypectomy  ?[id number]   Social History   Social History  . Marital Status: Widowed    Spouse Name: N/A  . Number of Children: 3  . Years of Education: N/A    Occupational History  . homemaker    Social History Main Topics  . Smoking status: Former Smoker -- 2 years  . Smokeless tobacco: Former NeurosurgeonUser  . Alcohol Use: No     Comment: did drink a little in distant past  . Drug Use: None  . Sexual Activity: Not Asked   Other Topics Concern  . None   Social History Narrative   Divorced then widowed   [place] in assisted living [place] [facility] [place]   Loves reading      Had an advanced directive   Requested DNR and order done   Would accept hospitalization   Would not want feeding tube   Requests son Jonny RuizJohn (who lives locally) to be health care POA   Family History  Problem Relation Age of Onset  . Diabetes Neg Hx   . Hypertension Neg Hx   . Breast cancer Neg Hx   . Colon cancer Neg Hx    Allergies  Allergen Reactions  . Codeine Other (See Comments)    Reaction:  Unknown   . Sulfa Antibiotics Other (See Comments)    Reaction:  Unknown    Prior to Admission medications   Medication Sig Start Date End Date Taking? Authorizing Provider  acetaminophen (TYLENOL ARTHRITIS PAIN) 650 MG CR tablet Take 650 mg by mouth 2 (two) times daily.    Yes Historical Provider, MD  acetaminophen (TYLENOL) 325 MG tablet Take 650 mg by mouth 3 (  three) times daily as needed for mild pain.   Yes Historical Provider, MD  albuterol (PROVENTIL HFA;VENTOLIN HFA) 108 (90 BASE) MCG/ACT inhaler Inhale 2 puffs into the lungs every 4 (four) hours as needed for wheezing or shortness of breath.   Yes Historical Provider, MD  alum & mag hydroxide-simeth (MAALOX/MYLANTA) 200-200-20 MG/5ML suspension Take 30 mLs by mouth every 4 (four) hours as needed for indigestion, heartburn or flatulence.   Yes Historical Provider, MD  aspirin 81 MG chewable tablet Chew 81 mg by mouth daily.   Yes Historical Provider, MD  carbidopa-levodopa (SINEMET) 25-100 MG per tablet Take 1 tablet by mouth 4 (four) times daily.     Yes Historical Provider, MD  furosemide (LASIX)  20 MG tablet Take 20 mg by mouth daily.    Yes Historical Provider, MD  HYDROcodone-acetaminophen (NORCO/VICODIN) 5-325 MG per tablet Take 1 tablet by mouth every 4 (four) hours as needed (pain). Patient taking differently: Take 1 tablet by mouth every 4 (four) hours as needed for moderate pain.  06/23/13  Yes Karie Schwalbe, MD  loratadine (CLARITIN) 10 MG tablet Take 10 mg by mouth daily.   Yes Historical Provider, MD  Multiple Vitamin (MULTIVITAMIN WITH MINERALS) TABS tablet Take 1 tablet by mouth at bedtime.   Yes Historical Provider, MD  Multiple Vitamins-Minerals (PRESERVISION AREDS 2 PO) Take 1 tablet by mouth daily at 12 noon.   Yes Historical Provider, MD  pramipexole (MIRAPEX) 0.25 MG tablet Take 0.25 mg by mouth 3 (three) times daily.     Yes Historical Provider, MD  sertraline (ZOLOFT) 25 MG tablet Take 25 mg by mouth at bedtime.    Yes Historical Provider, MD  Vitamin D, Ergocalciferol, (DRISDOL) 50000 UNITS CAPS capsule Take 50,000 Units by mouth every 30 (thirty) days. Pt takes on the 24th of every month.   Yes Historical Provider, MD   Dg Chest 1 View  01/14/2015  CLINICAL DATA:  Poor preop.  Status post unwitnessed fall. EXAM: CHEST 1 VIEW COMPARISON:  September 06, 2013 FINDINGS: The heart size and mediastinal contours are stable. The heart size is enlarged. There are calcified granulomas in the left lateral mid lung unchanged. Mild chronic diffuse increased pulmonary interstitium are unchanged. There is no focal pneumonia or pleural effusion. The visualized skeletal structures are stable. Prior cholecystectomy clips are noted. IMPRESSION: No active cardiopulmonary disease. Electronically Signed   By: Sherian Rein M.D.   On: 01/14/2015 07:23   Ct Head Wo Contrast  01/14/2015  CLINICAL DATA:  Status post unwitnessed fall, with headache. Initial encounter. EXAM: CT HEAD WITHOUT CONTRAST TECHNIQUE: Contiguous axial images were obtained from the base of the skull through the vertex  without intravenous contrast. COMPARISON:  CT of the head performed 05/18/2014 FINDINGS: There is no evidence of acute infarction, mass lesion, or intra- or extra-axial hemorrhage on CT. Prominence of ventricles and sulci reflects mild to moderate cortical volume loss. Mild cerebellar atrophy is noted. Scattered periventricular and subcortical white matter change likely reflects small vessel ischemic microangiopathy. A chronic infarct is noted at the right external capsule. The brainstem and fourth ventricle are within normal limits. The cerebral hemispheres demonstrate grossly normal gray-white differentiation. No mass effect or midline shift is seen. There is no evidence of fracture; visualized osseous structures are unremarkable in appearance. The orbits are within normal limits. The paranasal sinuses and mastoid air cells are well-aerated. No significant soft tissue abnormalities are seen. IMPRESSION: 1. No acute intracranial pathology seen on CT. 2. Mild  to moderate cortical volume loss and scattered small vessel ischemic microangiopathy. 3. 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 Electronically Signed   By: Roanna Raider M.D.   On: 01/14/2015 06:15   Dg Knee Complete 4 Views Right  01/14/2015  CLINICAL DATA:  Acute onset of right knee swelling and pain, status post unwitnessed fall. Initial encounter. EXAM: RIGHT KNEE - COMPLETE 4+ VIEW COMPARISON:  None. FINDINGS: There is a displaced oblique fracture across the left femoral diaphysis. The joint spaces are preserved. Mild marginal osteophytes are noted at the medial and lateral compartments. The intramedullary rod is grossly unremarkable in appearance, though the fracture is thought to extend to the intramedullary rod. No significant joint effusion is seen. Scattered vascular calcifications are seen. IMPRESSION: Displaced oblique fracture across the left femoral diaphysis, thought to extend to the intramedullary rod. Electronically Signed   By:  Roanna Raider M.D.   On: 01/14/2015 06:56   Dg Hip Unilat With Pelvis 2-3 Views Right  01/14/2015  CLINICAL DATA:  Status post unwitnessed fall, with right lower leg swelling. Initial encounter. EXAM: DG HIP (WITH OR WITHOUT PELVIS) 2-3V RIGHT COMPARISON:  None. FINDINGS: There is a displaced fracture at the proximal to mid diaphysis of the right femur, with medial and anterior displacement and shortening. This extends to the patient's intramedullary rod. The femoral heads remain seated normally within the respective acetabula. The sacroiliac joints are grossly unremarkable. Scattered vascular calcifications are seen. The visualized bowel gas pattern is grossly unremarkable. IMPRESSION: Displaced fracture at the proximal to mid diaphysis of the right femur, with medial and anterior displacement and shortening. This extends to the intramedullary rod. Electronically Signed   By: Roanna Raider M.D.   On: 01/14/2015 06:27   Dg Femur Min 2 Views Left  01/14/2015  CLINICAL DATA:  Left leg pain.  Unwitnessed fall. EXAM: LEFT FEMUR 2 VIEWS COMPARISON:  None. FINDINGS: The bones appear diffusely osteopenic. There is an acute, oblique fracture deformity involving the proximal femur. There is medial angulation of the distal fracture fragments. Vascular calcifications are noted. IMPRESSION: 1. Acute, oblique fracture involves the proximal left femur. Electronically Signed   By: Signa Kell M.D.   On: 01/14/2015 07:21   Dg Femur, Min 2 Views Right  01/14/2015  CLINICAL DATA:  Bilateral femur pain status post unwitnessed fall. EXAM: RIGHT FEMUR 2 VIEWS COMPARISON:  None. FINDINGS: There is osteopenia. There is a displaced oblique fracture through the mid and distal right femoral shaft with medial and anterior displacement the fracture line extends to the distal portion of the intra medullary rod. Patient is status post intra medullary rod and screw fixation. There is no evidence of intra-articular extension. No  evidence of right knee joint effusion. Vascular calcifications are noted. IMPRESSION: Oblique displaced fracture of the mid to distal right femoral shaft with medial and anterior displacement and mild overriding of the fracture fragments. Status post intra medullary rod and screw fixation of the right femur. Osteopenia. Electronically Signed   By: Ted Mcalpine M.D.   On: 01/14/2015 17:09    Positive ROS: All other systems have been reviewed and were otherwise negative with the exception of those mentioned in the HPI and as above.  Physical Exam: General: Alert and alert in no acute distress. HEENT: Atraumatic and normocephalic. Sclera are clear. Extraocular motion is intact. Oropharynx is clear with moist mucosa. Neck: Supple, nontender, good range of motion. No JVD or carotid bruits. Lungs: Clear to auscultation bilaterally. Cardiovascular: Regular rate  and rhythm with normal S1 and S2. No murmurs. No gallops or rubs. Pedal pulses are palpable bilaterally. Homans test is negative bilaterally. No significant pretibial or ankle edema. Abdomen: Soft, nontender, and nondistended. Bowel sounds are present. Skin: No lesions in the area of chief complaint Neurologic: Awake, alert, and oriented. Sensory function is grossly intact. Motor strength is felt to be 5 over 5 bilaterally. No clonus or tremor. Good motor coordination. Lymphatic: No axillary or cervical lymphadenopathy  MUSCULOSKELETAL:   Mild bilateral lower extremity swelling with pulses intact. Poor care of her toenails bilaterally. No evidence of skin lesions on the right or left side. Obvious shortening and external rotation of the left leg. The right side is also shortened, but there is not significant deformity on that side. Pulses 2+ bilaterally Neurovascularly intact distally  No other obvious orthopedic injuries  Assessment: 81 year old female with bilateral femur fractures, stable right fracture through a prior existing  nail, left unstable subtrochanteric fracture.   I had an extensive discussion with 2 of her sons as well as the daughter-in-law regarding surgical intervention risks of the surgery including infection damage to nerves or blood vessels fat embolus blood clots and the potential of loss of life during the surgery was reviewed with the patient as well as the family. At this time the family would like to proceed with left subtrochanteric fixation using intramedullary rod.  Plan:  Foley placement now Lactated Ringer's at 100 mL/h 2 units of packed red blood cells to the OR Hospitalist to see patient to review radical history and medically optimize the patient. Hospitalist to admit the patient. 2 g of Ancef to the operating room Patient's family consented, patient marked.  Plan on performing the operation in a timely manner.   Pietro Cassis MD

## 2015-01-14 NOTE — Discharge Instructions (Signed)
1. You have a left femur fracture which will be treated without surgery. Wear knee immobilizer as directed.  2. Please check the skin on patient's left thigh for any signs of skin breakdown. 3. Return to the ER for worsening symptoms, persistent vomiting, difficulty breathing or other concerns.  Femoral Shaft Fracture A femoral shaft fracture is a break (fracture) in the shaft of the thigh bone (femur). The femur is the long bone that connects the hip joint to the knee joint. Most femoral shaft fractures are closed fractures. A closed fracture is a break in a bone that happens without any cuts (lacerations) through the skin that is near the fracture site. Some femoral shaft fractures are open fractures. An open fracture is a break in a bone that happens along with lacerations through the skin that is near the fracture site.  CAUSES A healthy femur may break from a forceful impact, such as from:  A fall, especially from a great height.  A high-impact sports injury.  A car or motorcycle accident. A weakened femur may break from minimal impact or force due to:  Certain medical conditions.  Age. RISK FACTORS This condition is more likely to develop in:  Older people.  People who have certain medical conditions that cause bones to become weak or thin, such as:  Osteoporosis.  Cancer.  Osteogenesis imperfecta. This is a condition that involves bone weakness that is due to abnormal bone development.  People who take certain medicines (bisphosphonates) that are used to treat osteoporosis.  People who participate in high-risk sports or impact sports. SYMPTOMS Symptoms of this condition include:  Severe pain.  Inability to walk.  Bruising.  Swelling or visible deformity of the leg.  Substantial bleeding, if it is an open fracture. DIAGNOSIS This condition is diagnosed based on your symptoms and a physical exam. You may also have other tests, including:  X-rays of the femur.  Since the force required to break a healthy femur can break other bones, X-rays are often taken of the hip, knee, and pelvis as well.  Evaluation of the blood vessels with specialized X-rays (arteriogram).  Evaluation of the nerves in the area of the break.  CT scan or MRI. TREATMENT A femoral shaft fracture usually requires surgery. You may have one or more of the following surgical treatments:  External fixation. This involves using pins and screws to hold the bones in place if there is extensive soft tissue injury. After some time, you may need an additional surgical treatment, such as intramedullary nailing.  Intramedullary nailing. This involves inserting a rod (intramedullary nail) through an incision. The intramedullary nail goes down the center of the shaft of the femur. It may be inserted in the knee joint or from the top of the femur near the hip. Generally, screws are placed through the rod at both ends to prevent shortening or rotation of the femur as it heals.  Plates to stabilize the fracture. These may be used, especially when the fracture is at either end of the bone, near the hip or the knee. In rare cases for which surgery is not an option, a cast or a splint may be used to hold (immobilize) the bone while it heals. PREVENTION If factors such as certain medical conditions or age increase your risk for another femoral shaft fracture, you may be able to prevent one if you follow these instructions:  Use aids for walking, such as a walker or cane, as directed by your health care  provider.  Follow instructions from your health care provider about how to strengthen your bones if you have osteoporosis.   This information is not intended to replace advice given to you by your health care provider. Make sure you discuss any questions you have with your health care provider.   Document Released: 10/16/2004 Document Revised: 05/23/2014 Document Reviewed: 08/22/2013 Elsevier  Interactive Patient Education [employer]

## 2015-01-15 ENCOUNTER — Encounter
Admission: EM | Disposition: A | Discharge status: Skilled Nursing Facility | Payer: Self-pay | Source: Home / Self Care | Attending: Internal Medicine

## 2015-01-15 ENCOUNTER — Inpatient Hospital Stay: Payer: Federal, State, Local not specified - PPO | Admitting: Anesthesiology

## 2015-01-15 ENCOUNTER — Inpatient Hospital Stay: Payer: Federal, State, Local not specified - PPO

## 2015-01-15 ENCOUNTER — Encounter: Payer: Self-pay | Admitting: Anesthesiology

## 2015-01-15 HISTORY — PX: FEMUR IM NAIL: SHX1597

## 2015-01-15 LAB — BASIC METABOLIC PANEL
ANION GAP: 6 (ref 5–15)
BUN: 23 mg/dL — ABNORMAL HIGH (ref 6–20)
CHLORIDE: 106 mmol/L (ref 101–111)
CO2: 27 mmol/L (ref 22–32)
Calcium: 8.2 mg/dL — ABNORMAL LOW (ref 8.9–10.3)
Creatinine, Ser: 0.92 mg/dL (ref 0.44–1.00)
GFR calc non Af Amer: 50 mL/min — ABNORMAL LOW (ref >60)
GFR, EST AFRICAN AMERICAN: 58 mL/min — AB (ref >60)
Glucose, Bld: 123 mg/dL — ABNORMAL HIGH (ref 65–99)
POTASSIUM: 4.4 mmol/L (ref 3.5–5.1)
SODIUM: 139 mmol/L (ref 135–145)

## 2015-01-15 LAB — CBC
HCT: 27.2 % — ABNORMAL LOW (ref 35.0–47.0)
HEMOGLOBIN: 9.1 g/dL — AB (ref 12.0–16.0)
MCH: 30.8 pg (ref 26.0–34.0)
MCHC: 33.6 g/dL (ref 32.0–36.0)
MCV: 91.8 fL (ref 80.0–100.0)
Platelets: 164 10*3/uL (ref 150–440)
RBC: 2.96 MIL/uL — AB (ref 3.80–5.20)
RDW: 13.3 % (ref 11.5–14.5)
WBC: 13.3 10*3/uL — ABNORMAL HIGH (ref 3.6–11.0)

## 2015-01-15 LAB — PROTIME-INR
INR: 1.12
PROTHROMBIN TIME: 14.6 s (ref 11.4–15.0)

## 2015-01-15 SURGERY — INSERTION, INTRAMEDULLARY ROD, FEMUR
Anesthesia: Spinal | Site: Hip | Laterality: Left | Wound class: Clean

## 2015-01-15 MED ORDER — MAGNESIUM CITRATE PO SOLN
1.0000 | Freq: Once | ORAL | Status: DC | PRN
  Filled 2015-01-15: qty 296

## 2015-01-15 MED ORDER — POLYETHYLENE GLYCOL 3350 17 G PO PACK
17.0000 g | PACK | Freq: Every day | ORAL | Status: DC | PRN
  Filled 2015-01-15: qty 1

## 2015-01-15 MED ORDER — BISACODYL 10 MG RE SUPP
10.0000 mg | Freq: Every day | RECTAL | Status: DC | PRN
  Administered 2015-01-16: 10 mg via RECTAL
  Filled 2015-01-15: qty 1

## 2015-01-15 MED ORDER — SODIUM CHLORIDE 0.9 % IV SOLN
75.0000 mL/h | INTRAVENOUS | Status: DC
Start: 2015-01-15 — End: 2015-01-17
  Administered 2015-01-15 – 2015-01-16 (×3): 75 mL/h via INTRAVENOUS

## 2015-01-15 MED ORDER — CEFAZOLIN SODIUM 1-5 GM-% IV SOLN
1.0000 g | Freq: Four times a day (QID) | INTRAVENOUS | Status: AC
  Administered 2015-01-15 (×2): 1 g via INTRAVENOUS
  Filled 2015-01-15 (×2): qty 50

## 2015-01-15 MED ORDER — DOCUSATE SODIUM 100 MG PO CAPS
100.0000 mg | ORAL_CAPSULE | Freq: Two times a day (BID) | ORAL | Status: DC
  Administered 2015-01-15 – 2015-01-16 (×4): 100 mg via ORAL
  Filled 2015-01-15 (×5): qty 1

## 2015-01-15 MED ORDER — ACETAMINOPHEN 325 MG PO TABS
650.0000 mg | ORAL_TABLET | Freq: Four times a day (QID) | ORAL | Status: DC | PRN
  Administered 2015-01-16: 650 mg via ORAL
  Filled 2015-01-15: qty 2

## 2015-01-15 MED ORDER — HALOPERIDOL LACTATE 5 MG/ML IJ SOLN
2.5000 mg | Freq: Once | INTRAMUSCULAR | Status: AC
  Administered 2015-01-15: 2.5 mg via INTRAVENOUS
  Filled 2015-01-15: qty 1

## 2015-01-15 MED ORDER — BUPIVACAINE HCL (PF) 0.5 % IJ SOLN
INTRAMUSCULAR | Status: DC | PRN
  Administered 2015-01-15: 2.5 mL

## 2015-01-15 MED ORDER — MENTHOL 3 MG MT LOZG
1.0000 | LOZENGE | OROMUCOSAL | Status: DC | PRN
  Filled 2015-01-15: qty 9

## 2015-01-15 MED ORDER — ENOXAPARIN SODIUM 30 MG/0.3ML ~~LOC~~ SOLN
30.0000 mg | SUBCUTANEOUS | Status: DC
  Administered 2015-01-16 – 2015-01-17 (×2): 30 mg via SUBCUTANEOUS
  Filled 2015-01-15 (×2): qty 0.3

## 2015-01-15 MED ORDER — FENTANYL CITRATE (PF) 100 MCG/2ML IJ SOLN: 25.0000 ug | INTRAMUSCULAR | Status: DC | PRN

## 2015-01-15 MED ORDER — ONDANSETRON HCL 4 MG PO TABS: 4.0000 mg | ORAL_TABLET | Freq: Four times a day (QID) | ORAL | Status: DC | PRN

## 2015-01-15 MED ORDER — MIDAZOLAM HCL 5 MG/5ML IJ SOLN
INTRAMUSCULAR | Status: DC | PRN
  Administered 2015-01-15 (×2): .5 mg via INTRAVENOUS

## 2015-01-15 MED ORDER — SENNA 8.6 MG PO TABS
1.0000 | ORAL_TABLET | Freq: Two times a day (BID) | ORAL | Status: DC
Start: 2015-01-15 — End: 2015-01-17
  Administered 2015-01-15 – 2015-01-16 (×4): 8.6 mg via ORAL
  Filled 2015-01-15 (×5): qty 1

## 2015-01-15 MED ORDER — KETAMINE HCL 10 MG/ML IJ SOLN
INTRAMUSCULAR | Status: DC | PRN
  Administered 2015-01-15: 15 mg via INTRAVENOUS
  Administered 2015-01-15: 10 mg via INTRAVENOUS
  Administered 2015-01-15: 15 mg via INTRAVENOUS
  Administered 2015-01-15: 10 mg via INTRAVENOUS

## 2015-01-15 MED ORDER — OXYCODONE HCL 5 MG PO TABS
5.0000 mg | ORAL_TABLET | ORAL | Status: DC | PRN
  Administered 2015-01-15 – 2015-01-17 (×4): 5 mg via ORAL
  Filled 2015-01-15 (×5): qty 1

## 2015-01-15 MED ORDER — ACETAMINOPHEN 650 MG RE SUPP: 650.0000 mg | Freq: Four times a day (QID) | RECTAL | Status: DC | PRN

## 2015-01-15 MED ORDER — PHENYLEPHRINE HCL 10 MG/ML IJ SOLN
INTRAMUSCULAR | Status: DC | PRN
  Administered 2015-01-15: 200 ug via INTRAVENOUS
  Administered 2015-01-15 (×12): 100 ug via INTRAVENOUS

## 2015-01-15 MED ORDER — ONDANSETRON HCL 4 MG/2ML IJ SOLN: 4.0000 mg | Freq: Once | INTRAMUSCULAR | Status: DC | PRN

## 2015-01-15 MED ORDER — ENOXAPARIN SODIUM 40 MG/0.4ML ~~LOC~~ SOLN: 40.0000 mg | SUBCUTANEOUS | Status: DC

## 2015-01-15 MED ORDER — NEOMYCIN-POLYMYXIN B GU 40-200000 IR SOLN
Status: DC | PRN
  Administered 2015-01-15: 2 mL

## 2015-01-15 MED ORDER — PHENOL 1.4 % MT LIQD
1.0000 | OROMUCOSAL | Status: DC | PRN
  Filled 2015-01-15: qty 177

## 2015-01-15 MED ORDER — ALUM & MAG HYDROXIDE-SIMETH 200-200-20 MG/5ML PO SUSP: 30.0000 mL | ORAL | Status: DC | PRN

## 2015-01-15 MED ORDER — ONDANSETRON HCL 4 MG/2ML IJ SOLN: 4.0000 mg | Freq: Four times a day (QID) | INTRAMUSCULAR | Status: DC | PRN

## 2015-01-15 MED ORDER — LACTATED RINGERS IV BOLUS (SEPSIS)
250.0000 mL | Freq: Once | INTRAVENOUS | Status: DC
  Administered 2015-01-15: 250 mL via INTRAVENOUS

## 2015-01-15 MED ORDER — PROPOFOL 500 MG/50ML IV EMUL
INTRAVENOUS | Status: DC | PRN
  Administered 2015-01-15: 25 ug/kg/min via INTRAVENOUS

## 2015-01-15 MED ORDER — EPHEDRINE SULFATE 50 MG/ML IJ SOLN
INTRAMUSCULAR | Status: DC | PRN
  Administered 2015-01-15: 5 mg via INTRAVENOUS
  Administered 2015-01-15 (×2): 10 mg via INTRAVENOUS

## 2015-01-15 MED ORDER — MORPHINE SULFATE (PF) 2 MG/ML IV SOLN
2.0000 mg | INTRAVENOUS | Status: DC | PRN
  Administered 2015-01-15: 2 mg via INTRAVENOUS
  Filled 2015-01-15: qty 1

## 2015-01-15 SURGICAL SUPPLY — 36 items
BIT DRILL 4.3MMS DISTAL GRDTED (BIT) ×1 IMPLANT
BNDG COHESIVE 6X5 TAN STRL LF (GAUZE/BANDAGES/DRESSINGS) ×6 IMPLANT
CANISTER SUCT 1200ML W/VALVE (MISCELLANEOUS) ×3 IMPLANT
CORTICAL BONE SCR 5.0MM X 46MM (Screw) ×3 IMPLANT
DRAPE SHEET LG 3/4 BI-LAMINATE (DRAPES) ×3 IMPLANT
DRAPE SURG 17X11 SM STRL (DRAPES) ×6 IMPLANT
DRAPE U-SHAPE 47X51 STRL (DRAPES) ×3 IMPLANT
DRILL 4.3MMS DISTAL GRADUATED (BIT) ×3
DRSG OPSITE POSTOP 4X14 (GAUZE/BANDAGES/DRESSINGS) ×6 IMPLANT
DURAPREP 26ML APPLICATOR (WOUND CARE) ×6 IMPLANT
GLOVE BIOGEL PI IND STRL 9 (GLOVE) ×1 IMPLANT
GLOVE BIOGEL PI INDICATOR 9 (GLOVE) ×2
GLOVE SURG 9.0 ORTHO LTXF (GLOVE) ×6 IMPLANT
GOWN STRL REUS TWL 2XL XL LVL4 (GOWN DISPOSABLE) ×3 IMPLANT
GOWN STRL REUS W/ TWL LRG LVL3 (GOWN DISPOSABLE) ×1 IMPLANT
GOWN STRL REUS W/TWL LRG LVL3 (GOWN DISPOSABLE) ×2
GUIDEPIN VERSANAIL DSP 3.2X444 ×3 IMPLANT
GUIDEWIRE BALL NOSE 80CM (WIRE) ×3 IMPLANT
HEMOVAC 400CC 10FR (MISCELLANEOUS) IMPLANT
HFN LH 130 DEG 11MM X 340MM (Nail) ×3 IMPLANT
KIT RM TURNOVER CYSTO AR (KITS) ×3 IMPLANT
MAT BLUE FLOOR 46X72 FLO (MISCELLANEOUS) ×3 IMPLANT
NS IRRIG 1000ML POUR BTL (IV SOLUTION) ×3 IMPLANT
PACK HIP COMPR (MISCELLANEOUS) ×3 IMPLANT
PAD GROUND ADULT SPLIT (MISCELLANEOUS) ×3 IMPLANT
SCREW BONE CORTICAL 5.0X54 (Screw) ×3 IMPLANT
SCREW CORTICL BON 5.0MM X 46MM (Screw) ×1 IMPLANT
SCREW LAG HIP NAIL 10.5X95 (Screw) ×3 IMPLANT
SCREWDRIVER HEX TIP 3.5MM (MISCELLANEOUS) ×3 IMPLANT
STAPLER SKIN PROX 35W (STAPLE) ×3 IMPLANT
SUCTION FRAZIER TIP 10 FR DISP (SUCTIONS) IMPLANT
SUT VIC AB 0 CT1 36 (SUTURE) ×3 IMPLANT
SUT VIC AB 2-0 CT1 27 (SUTURE) ×2
SUT VIC AB 2-0 CT1 TAPERPNT 27 (SUTURE) ×1 IMPLANT
SUT VICRYL 0 AB UR-6 (SUTURE) ×3 IMPLANT
SYR 30ML LL (SYRINGE) IMPLANT

## 2015-01-15 NOTE — NC FL2 (Signed)
[place] MEDICAID FL2 LEVEL OF CARE SCREENING TOOL     IDENTIFICATION  Patient Name: Rachel PrudeLila V Faulkner Birthdate: 1914-09-12 Sex: female Admission Date (Current Location): 01/14/2015  [place]   Facility and Address:  [facility], [address], [place], [place] [address]      Provider Number: [id number]  Attending Physician Name and Address:  Houston SirenVivek J Sainani, MD  Relative Name and Phone Number:       Current Level of Care: Hospital Recommended Level of Care: Skilled Nursing Facility Prior Approval Number:    Date Approved/Denied:   PASRR Number: [id number]  Discharge Plan: SNF    Current Diagnoses: Patient Active Problem List   Diagnosis Date Noted  . Bilateral hip fractures (HCC) 01/14/2015  . DEMENTIA 10/26/2006  . ANXIETY 10/26/2006  . DEPRESSION 10/26/2006  . PARKINSON'S DISEASE 10/26/2006  . OSTEOARTHRITIS 10/26/2006  . OSTEOPOROSIS 10/26/2006  . URINARY INCONTINENCE 10/26/2006    Orientation RESPIRATION BLADDER Height & Weight       Normal Continent   133 lbs.  BEHAVIORAL SYMPTOMS/MOOD NEUROLOGICAL BOWEL NUTRITION STATUS      Continent Diet  AMBULATORY STATUS COMMUNICATION OF NEEDS Skin   Total Care Verbally Surgical wounds                       Personal Care Assistance Level of Assistance  Bathing, Dressing Bathing Assistance: Maximum assistance   Dressing Assistance: Maximum assistance Total Care Assistance: Maximum assistance   Functional Limitations Info             SPECIAL CARE FACTORS FREQUENCY  PT (By licensed PT)                    Contractures Contractures Info: Not present    Additional Factors Info  Code Status, Allergies Code Status Info: DNR Allergies Info: codeine; sulf abx           Current Medications (01/15/2015):  This is the current hospital active medication list Current Facility-Administered Medications  Medication Dose Route Frequency  Provider Last Rate Last Dose  . [MAR Hold] acetaminophen (TYLENOL) tablet 650 mg  650 mg Oral TID PRN Ramonita LabAruna Gouru, MD      . Mitzi Hansen[MAR Hold] albuterol (PROVENTIL) (2.5 MG/3ML) 0.083% nebulizer solution 2.5 mg  2.5 mg Nebulization Q4H PRN Cindi CarbonMary M Swayne, RPH      . [MAR Hold] carbidopa-levodopa (SINEMET IR) 25-100 MG per tablet immediate release 1 tablet  1 tablet Oral QID Ramonita LabAruna Gouru, MD   1 tablet at 01/14/15 2155  . [MAR Hold] ceFAZolin (ANCEF) IVPB 2 g/50 mL premix  2 g Intravenous 3 times per day Pietro CassisBenjamin R Parker, MD   2 g at 01/15/15 0910  . [MAR Hold] docusate sodium (COLACE) capsule 100 mg  100 mg Oral BID Ramonita LabAruna Gouru, MD   100 mg at 01/14/15 2155  . [MAR Hold] HYDROcodone-acetaminophen (NORCO/VICODIN) 5-325 MG per tablet 1 tablet  1 tablet Oral Q4H PRN Ramonita LabAruna Gouru, MD      . lactated ringers infusion   Intravenous Continuous Pietro CassisBenjamin R Parker, MD 100 mL/hr at 01/14/15 2128    . [MAR Hold] morphine 2 MG/ML injection 2 mg  2 mg Intravenous Q4H PRN Ramonita LabAruna Gouru, MD   2 mg at 01/15/15 0150  . neomycin-polymyxin B (NEOSPORIN) irrigation solution    PRN Juanell FairlyKevin Krasinski, MD   2 mL at 01/15/15 0946  . [MAR Hold] ondansetron (ZOFRAN) tablet 4 mg  4 mg Oral Q6H PRN Ramonita Lab, MD       Or  . Mitzi Hansen Hold] ondansetron (ZOFRAN) injection 4 mg  4 mg Intravenous Q6H PRN Ramonita Lab, MD      . Mitzi Hansen Hold] pantoprazole (PROTONIX) injection 40 mg  40 mg Intravenous Q24H Ramonita Lab, MD   40 mg at 01/14/15 2155  . [MAR Hold] pramipexole (MIRAPEX) tablet 0.25 mg  0.25 mg Oral TID Ramonita Lab, MD   0.25 mg at 01/14/15 2155  . [MAR Hold] sertraline (ZOLOFT) tablet 25 mg  25 mg Oral QHS Ramonita Lab, MD   25 mg at 01/14/15 2154   Facility-Administered Medications Ordered in Other Encounters  Medication Dose Route Frequency Provider Last Rate Last Dose  . bupivacaine (MARCAINE) 0.5 % injection   Other Anesthesia Intra-op Junious Silk, CRNA   2.5 mL at 01/15/15 0910  . ePHEDrine injection   Intravenous Anesthesia Intra-op  Junious Silk, CRNA   10 mg at 01/15/15 1008  . ketamine (KETALAR) injection    Anesthesia Intra-op Junious Silk, CRNA   15 mg at 01/15/15 9604  . midazolam (VERSED) 5 MG/5ML injection    Anesthesia Intra-op Junious Silk, CRNA   0.5 mg at 01/15/15 0844  . phenylephrine (NEO-SYNEPHRINE) injection   Intravenous Anesthesia Intra-op Junious Silk, CRNA   100 mcg at 01/15/15 1016  . propofol (DIPRIVAN) 500 MG/50ML infusion    Continuous PRN Junious Silk, CRNA 5.4 mL/hr at 01/15/15 0947 15 mcg/kg/min at 01/15/15 5409     Discharge Medications: Please see discharge summary for a list of discharge medications.  Relevant Imaging Results:  Relevant Lab Results:   Additional Information SS: [id number]  York Spaniel, LCSW

## 2015-01-15 NOTE — Progress Notes (Signed)
Low urine output.  Dr Cherlynn KaiserSainani informed of output total

## 2015-01-15 NOTE — Progress Notes (Signed)
[facility] - [place] at [facility]   PATIENT NAME: Rachel Faulkner    MR#:  [id number]  DATE OF BIRTH:  Apr 14, 1914  SUBJECTIVE:  CHIEF COMPLAINT:   Chief Complaint  Patient presents with  . Fall   Patient here status post fall and noted to have bilateral hip fractures. Status post open reduction internal fixation of the left hip. Postoperatively noted to be somewhat confused and agitated.  REVIEW OF SYSTEMS:    Review of Systems  Unable to perform ROS: dementia    Nutrition: Clear liquid Tolerating Diet: Yes Tolerating PT: Await eval.    DRUG ALLERGIES:   Allergies  Allergen Reactions  . Codeine Other (See Comments)    Reaction:  Unknown   . Sulfa Antibiotics Other (See Comments)    Reaction:  Unknown     VITALS:  Blood pressure 122/48, pulse 87, temperature 98 F (36.7 C), temperature source Oral, resp. rate 18, height 5\' 1"  (1.549 m), weight 60.328 kg (133 lb), SpO2 92 %.  PHYSICAL EXAMINATION:   Physical Exam  GENERAL:  81 y.o.-year-old patient lying in the bed lethargic/confused.  EYES: Pupils equal, round, reactive to light and accommodation. No scleral icterus. Extraocular muscles intact.  HEENT: Head atraumatic, normocephalic. Oropharynx and nasopharynx clear.  NECK:  Supple, no jugular venous distention. No thyroid enlargement, no tenderness.  LUNGS: Normal breath sounds bilaterally, no wheezing, rales, rhonchi. No use of accessory muscles of respiration.  CARDIOVASCULAR: S1, S2 normal. II/VI SEM at the base, No rubs, or gallops.  ABDOMEN: Soft, nontender, nondistended. Bowel sounds present. No organomegaly or mass.  EXTREMITIES: No cyanosis, clubbing or edema b/l.   Left hip dressing from surgery today.  NEUROLOGIC: Cranial nerves II through XII are intact. No focal Motor or sensory deficits b/l. + Parkinsonian tremor, confused/encephalopathic.     PSYCHIATRIC: The patient is alert and oriented x 1.  SKIN: No obvious rash,  lesion, or ulcer.    LABORATORY PANEL:   CBC  Recent Labs Lab 01/15/15 0319  WBC 13.3*  HGB 9.1*  HCT 27.2*  PLT 164   ------------------------------------------------------------------------------------------------------------------  Chemistries   Recent Labs Lab 01/15/15 0319  NA 139  K 4.4  CL 106  CO2 27  GLUCOSE 123*  BUN 23*  CREATININE 0.92  CALCIUM 8.2*   ------------------------------------------------------------------------------------------------------------------  Cardiac Enzymes No results for input(s): TROPONINI in the last 168 hours. ------------------------------------------------------------------------------------------------------------------  RADIOLOGY:  Dg Chest 1 View  01/14/2015  CLINICAL DATA:  Poor preop.  Status post unwitnessed fall. EXAM: CHEST 1 VIEW COMPARISON:  September 06, 2013 FINDINGS: The heart size and mediastinal contours are stable. The heart size is enlarged. There are calcified granulomas in the left lateral mid lung unchanged. Mild chronic diffuse increased pulmonary interstitium are unchanged. There is no focal pneumonia or pleural effusion. The visualized skeletal structures are stable. Prior cholecystectomy clips are noted. IMPRESSION: No active cardiopulmonary disease. Electronically Signed   By: Sherian Rein M.D.   On: 01/14/2015 07:23   Ct Head Wo Contrast  01/14/2015  CLINICAL DATA:  Status post unwitnessed fall, with headache. Initial encounter. EXAM: CT HEAD WITHOUT CONTRAST TECHNIQUE: Contiguous axial images were obtained from the base of the skull through the vertex without intravenous contrast. COMPARISON:  CT of the head performed 05/18/2014 FINDINGS: There is no evidence of acute infarction, mass lesion, or intra- or extra-axial hemorrhage on CT. Prominence of ventricles and sulci reflects mild to moderate cortical volume loss. Mild cerebellar atrophy is noted. Scattered periventricular and  subcortical white matter  change likely reflects small vessel ischemic microangiopathy. A chronic infarct is noted at the right external capsule. The brainstem and fourth ventricle are within normal limits. The cerebral hemispheres demonstrate grossly normal gray-white differentiation. No mass effect or midline shift is seen. There is no evidence of fracture; visualized osseous structures are unremarkable in appearance. The orbits are within normal limits. The paranasal sinuses and mastoid air cells are well-aerated. No significant soft tissue abnormalities are seen. IMPRESSION: 1. No acute intracranial pathology seen on CT. 2. Mild to moderate cortical volume loss and scattered small vessel ischemic microangiopathy. 3. 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 Electronically Signed   By: Roanna Raider M.D.   On: 01/14/2015 06:15   Dg Knee Complete 4 Views Right  01/14/2015  CLINICAL DATA:  Acute onset of right knee swelling and pain, status post unwitnessed fall. Initial encounter. EXAM: RIGHT KNEE - COMPLETE 4+ VIEW COMPARISON:  None. FINDINGS: There is a displaced oblique fracture across the left femoral diaphysis. The joint spaces are preserved. Mild marginal osteophytes are noted at the medial and lateral compartments. The intramedullary rod is grossly unremarkable in appearance, though the fracture is thought to extend to the intramedullary rod. No significant joint effusion is seen. Scattered vascular calcifications are seen. IMPRESSION: Displaced oblique fracture across the left femoral diaphysis, thought to extend to the intramedullary rod. Electronically Signed   By: Roanna Raider M.D.   On: 01/14/2015 06:56   Dg Hip Operative Unilat With Pelvis Left  01/15/2015  CLINICAL DATA:  Left subtrochanteric proximal femur fracture, status post ORIF EXAM: OPERATIVE left HIP (WITH PELVIS IF PERFORMED) 8 VIEWS TECHNIQUE: Fluoroscopic spot image(s) were submitted for interpretation post-operatively. COMPARISON:  01/15/2015  FINDINGS: Spot fluoroscopic intraoperative views demonstrate intra medullary rod insertion reducing the proximal left femur subtrochanteric fracture. Proximal and distal fixation screws in place. There is improved line with residual minimal displacement of the fracture. Peripheral atherosclerosis evident. Bones are osteopenic. IMPRESSION: Status post ORIF of the left femur fracture with improved alignment. Electronically Signed   By: Judie Petit.  Shick M.D.   On: 01/15/2015 14:19   Dg Hip Unilat With Pelvis 2-3 Views Right  01/14/2015  CLINICAL DATA:  Status post unwitnessed fall, with right lower leg swelling. Initial encounter. EXAM: DG HIP (WITH OR WITHOUT PELVIS) 2-3V RIGHT COMPARISON:  None. FINDINGS: There is a displaced fracture at the proximal to mid diaphysis of the right femur, with medial and anterior displacement and shortening. This extends to the patient's intramedullary rod. The femoral heads remain seated normally within the respective acetabula. The sacroiliac joints are grossly unremarkable. Scattered vascular calcifications are seen. The visualized bowel gas pattern is grossly unremarkable. IMPRESSION: Displaced fracture at the proximal to mid diaphysis of the right femur, with medial and anterior displacement and shortening. This extends to the intramedullary rod. Electronically Signed   By: Roanna Raider M.D.   On: 01/14/2015 06:27   Dg Femur Port, 1v Right  01/15/2015  CLINICAL DATA:  Right femur fracture, postop check EXAM: RIGHT FEMUR PORTABLE 1 VIEW COMPARISON:  01/14/2015 FINDINGS: bones are osteopenic. Right distal femur minimally displaced fracture again evident. Previous proximal fixation screw and right femur intra medullary rod stable in position. No interval change. Peripheral atherosclerosis noted. IMPRESSION: Stable minimally displaced right distal femur shaft fracture. Status post previous right femur intra medullary rod and proximal screw fixation Osteopenia Electronically Signed    By: Judie Petit.  Shick M.D.   On: 01/15/2015 13:04   Dg  Femur Min 2 Views Left  01/14/2015  CLINICAL DATA:  Left leg pain.  Unwitnessed fall. EXAM: LEFT FEMUR 2 VIEWS COMPARISON:  None. FINDINGS: The bones appear diffusely osteopenic. There is an acute, oblique fracture deformity involving the proximal femur. There is medial angulation of the distal fracture fragments. Vascular calcifications are noted. IMPRESSION: 1. Acute, oblique fracture involves the proximal left femur. Electronically Signed   By: Signa Kellaylor  Stroud M.D.   On: 01/14/2015 07:21   Dg Femur, Min 2 Views Right  01/14/2015  CLINICAL DATA:  Bilateral femur pain status post unwitnessed fall. EXAM: RIGHT FEMUR 2 VIEWS COMPARISON:  None. FINDINGS: There is osteopenia. There is a displaced oblique fracture through the mid and distal right femoral shaft with medial and anterior displacement the fracture line extends to the distal portion of the intra medullary rod. Patient is status post intra medullary rod and screw fixation. There is no evidence of intra-articular extension. No evidence of right knee joint effusion. Vascular calcifications are noted. IMPRESSION: Oblique displaced fracture of the mid to distal right femoral shaft with medial and anterior displacement and mild overriding of the fracture fragments. Status post intra medullary rod and screw fixation of the right femur. Osteopenia. Electronically Signed   By: Ted Mcalpineobrinka  Dimitrova M.D.   On: 01/14/2015 17:09   Dg Femur Port Min 2 Views Left  01/15/2015  CLINICAL DATA:  Status post ORIF proximal left femur, acute displaced proximal left femur subtrochanteric fracture EXAM: LEFT FEMUR PORTABLE 2 VIEWS COMPARISON:  01/14/2015 FINDINGS: Left femur intra medullary rod insertion crosses the proximal left femur subtrochanteric fracture. There is improved alignment with some residual displacement at the fracture. Fixation screw through the femoral neck. Two fixation screws distally. Expected postop  changes of the soft tissues. Peripheral atherosclerosis evident. IMPRESSION: Status post ORIF for a proximal left femur subtrochanteric fracture with improved alignment. Osteopenia Atherosclerosis Electronically Signed   By: Judie PetitM.  Shick M.D.   On: 01/15/2015 12:59     ASSESSMENT AND PLAN:   81 year old female with past medical history of dementia, Parkinson's disease, anxiety/depression, osteoporosis, urinary incontinence and presented to the hospital after a fall and noted to have a left hip fracture.  #1 bilateral hip fractures-patient apparently had a previous right-sided hip fracture which was managed nonoperatively. She now presented with a left-sided hip fracture which was going to be managed supportively but patient started having intractable pain and therefore was admitted to the hospital. -Patient is status post open reduction internal fixation of the left hip. -Continue pain control and care as per orthopedics. -Patient likely cannot do physical therapy given her hip fracture on the right. Prognosis is very poor but continue supportive care.  #2 Parkinson's disease-continue Sinemet.  #3 dementia without behavioral disturbance-avoid benzodiazepines. Continue when necessary Haldol for agitation.  #4 GERD-continue Protonix.  #5 anxiety/depression-continue Zoloft  #6 acute blood loss anemia-due to hip surgery. -Follow hemoglobin. No acute reason for transfusion presently.  All the records are reviewed and case discussed with Care Management/Social Workerr. Management plans discussed with the patient, family and they are in agreement.  CODE STATUS: DO NOT RESUSCITATE  DVT Prophylaxis: Lovenox  TOTAL TIME TAKING CARE OF THIS PATIENT: 30 minutes.   POSSIBLE D/C IN 2-3 DAYS, DEPENDING ON CLINICAL CONDITION.   Houston SirenSAINANI,VIVEK J M.D on 01/15/2015 at 3:40 PM  Between 7am to 6pm - Pager - [phone]  After 6pm go to [email or web address] - password [email or web address] [email or web address]  [facility] [place]   Office  [phone]  CC: Primary  care physician; Tillman Abide, MD

## 2015-01-15 NOTE — Anesthesia Preprocedure Evaluation (Addendum)
Anesthesia Evaluation  Patient identified by MRN, date of birth, ID band Patient confused    Reviewed: Allergy & Precautions, NPO status , Patient's Chart, lab work & pertinent test results  Airway Mallampati: II  TM Distance: <3 FB Neck ROM: Limited    Dental  (+) Upper Dentures   Pulmonary former smoker,    Pulmonary exam normal breath sounds clear to auscultation       Cardiovascular negative cardio ROS Normal cardiovascular exam + Systolic murmurs and + Peripheral Edema swelling   Neuro/Psych Anxiety Depression Parkinson's Dz    GI/Hepatic negative GI ROS, Neg liver ROS,   Endo/Other  negative endocrine ROS  Renal/GU negative Renal ROS Bladder dysfunction  Urinary incontinence    Musculoskeletal  (+) Arthritis , Osteoarthritis,  Fx femur   Abdominal Normal abdominal exam  (+)   Peds negative pediatric ROS (+)  Hematology negative hematology ROS (+)   Anesthesia Other Findings   Reproductive/Obstetrics                           Anesthesia Physical Anesthesia Plan  ASA: III  Anesthesia Plan: Spinal   Post-op Pain Management:    Induction: Intravenous  Airway Management Planned: Nasal Cannula  Additional Equipment:   Intra-op Plan:   Post-operative Plan:   Informed Consent: I have reviewed the patients History and Physical, chart, labs and discussed the procedure including the risks, benefits and alternatives for the proposed anesthesia with the patient or authorized representative who has indicated his/her understanding and acceptance.   Dental advisory given  Plan Discussed with: CRNA and Surgeon  Anesthesia Plan Comments:        Anesthesia Quick Evaluation

## 2015-01-15 NOTE — Clinical Social Work Note (Signed)
Clinical Social Work Assessment  Patient Details  Name: Rachel PrudeLila V Sago MRN: [id number] Date of Birth: 1915-01-12  Date of referral:  01/15/15               Reason for consult:  Facility Placement                Permission sought to share information with:    Permission granted to share information::   (patient down for surgery)  Name::        Agency::     Relationship::     Contact Information:     Housing/Transportation Living arrangements for the past 2 months:  Skilled Nursing Facility Source of Information:  Adult Children, Facility Patient Interpreter Needed:  None Criminal Activity/Legal Involvement Pertinent to Current Situation/Hospitalization:  No - Comment as needed Significant Relationships:  Adult Children Lives with:  Facility Resident Do you feel safe going back to the place where you live?    Need for family participation in patient care:     Care giving concerns:  None: patient resides [facility].    Social Worker assessment / plan:  Patient's son returned CSW phone call and confirmed that he does wish for patient to return [facility] at discharge.   Employment status:  Retired Network engineernsurance information:  Managed Care PT Recommendations:  Skilled Nursing Facility Information / Referral to community resources:  Skilled Nursing Facility  Patient/Family's Response to care:  Patient's son expressed appreciation for CSW call. Patient/Family's Understanding of and Emotional Response to Diagnosis, Current Treatment, and Prognosis:  Patient's son appears to have knowledge of patient's limitations.  Emotional Assessment Appearance:   (unable to assess) Attitude/Demeanor/Rapport:    Affect (typically observed):  Unable to Assess Orientation:    Alcohol / Substance use:  Not Applicable Psych involvement (Current and /or in the community):  No (Comment)  Discharge Needs  Concerns to be addressed:  Care Coordination Readmission within the last 30  days:  No Current discharge risk:  None Barriers to Discharge:  No Barriers Identified   York SpanielMonica Emmerich Cryer, LCSW 01/15/2015, 3:28 PM

## 2015-01-15 NOTE — Progress Notes (Signed)
Patient returned from surgery at [id number].  She has been very anxious.  Calling out.  She grabs the side bed rail and shakes it violently.  Pulling her own oxygen off.  Pulling her IV off. Trying to remove her gown

## 2015-01-15 NOTE — Op Note (Signed)
DATE OF SURGERY:  01/15/2015  TIME: 11:31 AM  PATIENT NAME:  Rachel Faulkner  AGE: 81 y.o.  PRE-OPERATIVE DIAGNOSIS:  left femur fracture  POST-OPERATIVE DIAGNOSIS:  SAME  PROCEDURE:  INTRAMEDULLARY (IM) NAIL FOR LEFT FEMUR FRACTURE  SURGEON:  Juanell FairlyKRASINSKI, Richad Ramsay  OPERATIVE IMPLANTS: Biomet long Affixus nail 340 x 11mm, 95 mm lag screw with a 46 and 54 mm distal interlocking screws  PREOPERATIVE INDICATIONS:  Rachel Faulkner is a 81 y.o. year old who fell and suffered bilateral femur fractures.Her right femur already has intramedullary fixation in place from a previous fracture 4 years ago per her family. She will not require further intervention of the right femur. She has a proximal left femoral shaft fracture which is significantly displaced. I recommended intramedullary fixation for this fracture. She was brought into the ER last night and then admitted by the hospitalist service and medically cleared for surgical intervention.    The risks, benefits and alternatives were discussed with the patient and their family.  The risks include but are not limited to infection, bleeding, nerve or blood vessel injury, malunion, nonunion, hardware prominence, hardware failure, change in leg lengths or lower extremity rotation need for further surgery including hardware removal with conversion to a total hip arthroplasty. Medical risks include but are not limited to DVT and pulmonary embolism, myocardial infarction, stroke, pneumonia, respiratory failure and death. The patient and their family understood these risks and wished to proceed with surgery.  OPERATIVE PROCEDURE:  The patient was brought to the operating room and placed in the supine position on the fracture table. General anesthesia was administered.  A closed reduction was performed under C-arm guidance.  The fracture reduction was confirmed on both AP and lateral views. A time out was performed to verify the patient's name, date of birth,  medical record number, correct site of surgery correct procedure to be performed. The timeout was also used to verify the patient received antibiotics and all appropriate instruments, implants and radiographic studies were available in the room. Once all in attendance were in agreement, the case began. The patient was prepped and draped in a sterile fashion. She received IV Kefzel 2 grams before the onset of the case.  An incision was made over the lateral thigh at the level of the fracture site. The fracture was manually reduced into fracture clamps were applied to hold the fracture in an anatomic position.Marland Kitchen.  Next an incision was made proximal to the greater trochanter in line with the femur. A guidewire was placed over the tip of the greater trochanter and advanced into the proximal femur to the level of the lesser trochanter.  Confirmation of the drill pin position was made on AP and lateral C-arm images.  The threaded guidepin was then overdrilled with the proximal femoral drill.  The nail was then inserted into the proximal femur, across the fracture site and into the femoral shaft. Its position was confirmed on AP and lateral C-arm images.   Once the nail was completely seated, the drill guide for the lag screw was placed through the guide arm for the Affixus nail. A guidepin was then placed through this drill guide and advanced through the lateral cortex of the femur and into the femoral head. The length of the drill pin was measured, and then the drill for the lag screw was advanced through the lateral cortex, across the fracture site and up into the femoral head to the depth of the lag screw..  The  lag screw was then advanced by hand into position  into the femoral head. Its final position was confirmed on AP and lateral C-arm images. Traction was carefully released to allow for compression at the fracture site.. The set screw in the top of the intramedullary rod was tightened by hand using a  screwdriver.  Attention was then turned to the distal interlocking screws. 2 distal interlocking screws were placed using perfect circle technique. The drill was advanced using a freehand technique. The depth of the interlocking screws was determined with a depth gauge. Once both screws were advanced into position by hand final AP and lateral C-arm images of the knee were taken to confirm adequate placement for the distal interlocking screws.   The wounds were irrigated copiously and closed with 0 Vicryl for closure of the deep fascia and 2-0 Vicryl for subcutaneous closure. The skin was approximated with staples. A dry sterile dressing was applied. I was scrubbed and present the entire case and all sharp and instrument counts were correct at the conclusion of the case. Patient was transferred to hospital bed and brought to PACU in stable condition. I spoke with the patient's family in the postop consultation room to let them know the case had gone without complication and the patient was stable in the recovery room.  She is nonambulatory at baseline and will be non-weightbearing on both lower extremities. The patient will started on medical DVT prophylaxis tomorrow.    Kathreen Devoid, MD

## 2015-01-15 NOTE — Clinical Social Work Note (Signed)
Rachel [facility]ulk[facility] at [facility] returned CSW phone call and stated that patient is a long term care resident with them in the healthcare building. Patient will return to that level of care at discharge. [name] MSW,LCSW [phone]

## 2015-01-15 NOTE — Clinical Social Work Note (Signed)
[facility] [facility] to [facility] record but it is unknown which area [place]. Patient has had a hip fracture and is currently off floor in surgery. No family present in room. CSW attempted to reach patient's son [name] via phone: [phone] but had to leave a message. Message left for Sue [facility]ushndrea at [facility] as well. FL2 completed an in epic. York SpanielMonica Tomy Khim MSW,LCSW [phone]

## 2015-01-15 NOTE — Progress Notes (Signed)
Pt sent to the OR via bed.  Transported by OR orderly

## 2015-01-15 NOTE — Progress Notes (Signed)
Pt had surgery today to repair fracture of her left hip.  Haldol helped her finally calm down.

## 2015-01-15 NOTE — Progress Notes (Signed)
rn spoke with dr Martha Clankrasinski . consentas obtained for im rodding left femur fx and blood consent

## 2015-01-15 NOTE — Consult Note (Signed)
ANTICOAGULATION CONSULT NOTE - Initial Consult  Pharmacy Consult for lovenox Indication: VTE prophylaxis  Allergies  Allergen Reactions  . Codeine Other (See Comments)    Reaction:  Unknown   . Sulfa Antibiotics Other (See Comments)    Reaction:  Unknown     Patient Measurements: Height: 5\' 1"  (154.9 cm) Weight: 133 lb (60.328 kg) IBW/kg (Calculated) : 47.8 Heparin Dosing Weight:   Vital Signs: Temp: 98.1 F (36.7 C) (12/26 1352) Temp Source: Axillary (12/26 1352) BP: 128/74 mmHg (12/26 1352) Pulse Rate: 92 (12/26 1352)  Labs:  Recent Labs  01/14/15 0503 01/14/15 1557 01/15/15 0319  HGB  --  9.4* 9.1*  HCT  --  27.6* 27.2*  PLT  --  206 164  LABPROT 13.5  --  14.6  INR 1.01  --  1.12  CREATININE  --  1.05* 0.92    Estimated Creatinine Clearance: 28.5 mL/min (by C-G formula based on Cr of 0.92).   Medical History: Past Medical History  Diagnosis Date  . Dementia without behavioral disturbance   . Parkinson disease (HCC)   . Anxiety   . Depression   . Arthritis   . Osteoporosis   . Urinary incontinence     Medications:  Scheduled:  . carbidopa-levodopa  1 tablet Oral QID  .  ceFAZolin (ANCEF) IV  1 g Intravenous Q6H  . docusate sodium  100 mg Oral BID  . [START ON 01/16/2015] enoxaparin (LOVENOX) injection  40 mg Subcutaneous Q24H  . pantoprazole (PROTONIX) IV  40 mg Intravenous Q24H  . pramipexole  0.25 mg Oral TID  . senna  1 tablet Oral BID  . sertraline  25 mg Oral QHS    Assessment: Pt is a 81 year old female status post hip surgery. Pharmacy consulted to dose VTE prohylaxis Goal of Therapy:   Monitor platelets by anticoagulation protocol: Yes   Plan:  Lovenox30mg  q 24 hours stating tomorrow AM. Pharmacy to continue to monitor  Lynde Ludwig D Tashira Torre 01/15/2015,2:56 PM

## 2015-01-15 NOTE — Anesthesia Procedure Notes (Addendum)
Spinal Patient location during procedure: OR Start time: 01/15/2015 8:45 AM End time: 01/15/2015 9:10 AM Staffing Anesthesiologist: Alvin Critchley Performed by: anesthesiologist  Preanesthetic Checklist Completed: patient identified, site marked, surgical consent, pre-op evaluation, timeout performed, IV checked, risks and benefits discussed and monitors and equipment checked Spinal Block Patient position: sitting Prep: Betadine Patient monitoring: heart rate, continuous pulse ox, blood pressure and cardiac monitor Approach: midline Location: L4-5 Injection technique: single-shot Needle Needle type: Whitacre, Introducer and Quincke  Needle gauge: 22 G Needle length: 9 cm Additional Notes Negative paresthesia. Negative blood return. Positive free-flowing CSF. Expiration date of kit checked and confirmed. Patient tolerated procedure well, without complications.    Date/Time: 01/15/2015 8:35 AM Performed by: Nelda Marseille Pre-anesthesia Checklist: Patient identified, Emergency Drugs available, Suction available, Patient being monitored and Timeout performed Oxygen Delivery Method: Simple face mask

## 2015-01-15 NOTE — Transfer of Care (Signed)
Immediate Anesthesia Transfer of Care Note  Patient: Rachel AquasLila V Cannan  Procedure(s) Performed: Procedure(s) with comments: INTRAMEDULLARY (IM) NAIL FEMORAL (Left) - femur  Patient Location: PACU  Anesthesia Type:Spinal  Level of Consciousness: sedated  Airway & Oxygen Therapy: Patient Spontanous Breathing and Patient connected to face mask oxygen  Post-op Assessment: Report given to RN and Post -op Vital signs reviewed and stable  Post vital signs: Reviewed and stable  Last Vitals:  Filed Vitals:   01/14/15 2126 01/15/15 0505  BP: 148/64 132/72  Pulse: 79 72  Temp: 37.1 C 36.8 C  Resp: 18     Complications: No apparent anesthesia complications

## 2015-01-16 ENCOUNTER — Encounter: Payer: Self-pay | Admitting: Orthopedic Surgery

## 2015-01-16 LAB — BASIC METABOLIC PANEL
ANION GAP: 7 (ref 5–15)
BUN: 26 mg/dL — AB (ref 6–20)
CO2: 27 mmol/L (ref 22–32)
Calcium: 7.7 mg/dL — ABNORMAL LOW (ref 8.9–10.3)
Chloride: 106 mmol/L (ref 101–111)
Creatinine, Ser: 0.96 mg/dL (ref 0.44–1.00)
GFR calc Af Amer: 55 mL/min — ABNORMAL LOW (ref >60)
GFR, EST NON AFRICAN AMERICAN: 48 mL/min — AB (ref >60)
GLUCOSE: 121 mg/dL — AB (ref 65–99)
POTASSIUM: 4.2 mmol/L (ref 3.5–5.1)
Sodium: 140 mmol/L (ref 135–145)

## 2015-01-16 LAB — TYPE AND SCREEN
ABO/RH(D): A NEG
Antibody Screen: NEGATIVE
UNIT DIVISION: 0
Unit division: 0
Unit division: 0
Unit division: 0

## 2015-01-16 LAB — CBC
HEMATOCRIT: 20.1 % — AB (ref 35.0–47.0)
Hemoglobin: 6.8 g/dL — ABNORMAL LOW (ref 12.0–16.0)
MCH: 31.2 pg (ref 26.0–34.0)
MCHC: 33.7 g/dL (ref 32.0–36.0)
MCV: 92.6 fL (ref 80.0–100.0)
PLATELETS: 145 10*3/uL — AB (ref 150–440)
RBC: 2.17 MIL/uL — AB (ref 3.80–5.20)
RDW: 13.4 % (ref 11.5–14.5)
WBC: 8.6 10*3/uL (ref 3.6–11.0)

## 2015-01-16 LAB — PREPARE RBC (CROSSMATCH)

## 2015-01-16 MED ORDER — SODIUM CHLORIDE 0.9 % IV SOLN
Freq: Once | INTRAVENOUS | Status: AC
  Administered 2015-01-16: 15:00:00 via INTRAVENOUS

## 2015-01-16 MED ORDER — SODIUM CHLORIDE 0.9 % IV SOLN: Freq: Once | INTRAVENOUS | Status: DC

## 2015-01-16 MED ORDER — HALOPERIDOL LACTATE 5 MG/ML IJ SOLN
2.5000 mg | Freq: Four times a day (QID) | INTRAMUSCULAR | Status: DC | PRN
  Administered 2015-01-16: 2.5 mg via INTRAVENOUS
  Filled 2015-01-16: qty 1

## 2015-01-16 MED ORDER — SODIUM CHLORIDE 0.9 % IV SOLN
Freq: Once | INTRAVENOUS | Status: AC
  Administered 2015-01-16: 11:00:00 via INTRAVENOUS

## 2015-01-16 NOTE — Progress Notes (Signed)
Pt with oliguria . rn spoke with dr mody who stated  Pt will need 2 nd of blood also. Blood bank notified

## 2015-01-16 NOTE — Plan of Care (Signed)
Problem: Nutrition: Goal: Ability to attain and maintain optimal nutritional status will improve Outcome: Progressing Strongly encouraging po fluids. Taking small sips  Problem: Pain Management: Goal: Pain level will decrease Outcome: Progressing Pain control with oral medications.

## 2015-01-16 NOTE — Progress Notes (Signed)
[facility] [place] [facility]   PATIENT NAME: Rachel Faulkner    MR#:  [id number]  DATE OF BIRTH:  Jan 25, 1914  SUBJECTIVE:  Patient is complaining of the IV in her hand.  REVIEW OF SYSTEMS:    Review of Systems  Unable to perform ROS: dementia      DRUG ALLERGIES:   Allergies  Allergen Reactions  . Codeine Other (See Comments)    Reaction:  Unknown   . Sulfa Antibiotics Other (See Comments)    Reaction:  Unknown     VITALS:  Blood pressure 133/42, pulse 83, temperature 98.6 F (37 C), temperature source Oral, resp. rate 16, height  (1.549 m), weight 60.328 kg (133 lb), SpO2 100 %.  PHYSICAL EXAMINATION:   Physical Exam  Constitutional: She is well-developed, well-nourished, and in no distress. No distress.  HENT:  Head: Normocephalic.  Eyes: No scleral icterus.  Neck: Normal range of motion. Neck supple. No JVD present. No tracheal deviation present.  Cardiovascular: Normal rate and regular rhythm.  Exam reveals no gallop and no friction rub.   Murmur heard. Pulmonary/Chest: Effort normal and breath sounds normal. No respiratory distress. She has no wheezes. She has no rales. She exhibits no tenderness.  Abdominal: Soft. Bowel sounds are normal. She exhibits no distension and no mass. There is no tenderness. There is no rebound and no guarding.  Musculoskeletal: Normal range of motion. She exhibits no edema.  Neurological: She is alert.  Skin: Skin is warm. No rash noted. No erythema.      LABORATORY PANEL:   CBC  Recent Labs Lab 01/16/15 0428  WBC 8.6  HGB 6.8*  HCT 20.1*  PLT 145*   ------------------------------------------------------------------------------------------------------------------  Chemistries   Recent Labs Lab 01/16/15 0428  NA 140  K 4.2  CL 106  CO2 27  GLUCOSE 121*  BUN 26*  CREATININE 0.96  CALCIUM 7.7*    ------------------------------------------------------------------------------------------------------------------  Cardiac Enzymes No results for input(s): TROPONINI in the last 168 hours. ------------------------------------------------------------------------------------------------------------------  RADIOLOGY:  Dg Hip Operative Unilat With Pelvis Left  01/15/2015  CLINICAL DATA:  Left subtrochanteric proximal femur fracture, status post ORIF EXAM: OPERATIVE left HIP (WITH PELVIS IF PERFORMED) 8 VIEWS TECHNIQUE: Fluoroscopic spot image(s) were submitted for interpretation post-operatively. COMPARISON:  01/15/2015 FINDINGS: Spot fluoroscopic intraoperative views demonstrate intra medullary rod insertion reducing the proximal left femur subtrochanteric fracture. Proximal and distal fixation screws in place. There is improved line with residual minimal displacement of the fracture. Peripheral atherosclerosis evident. Bones are osteopenic. IMPRESSION: Status post ORIF of the left femur fracture with improved alignment. Electronically Signed   By: Judie Petit.  Shick M.D.   On: 01/15/2015 14:19   Dg Femur Port, 1v Right  01/15/2015  CLINICAL DATA:  Right femur fracture, postop check EXAM: RIGHT FEMUR PORTABLE 1 VIEW COMPARISON:  01/14/2015 FINDINGS: bones are osteopenic. Right distal femur minimally displaced fracture again evident. Previous proximal fixation screw and right femur intra medullary rod stable in position. No interval change. Peripheral atherosclerosis noted. IMPRESSION: Stable minimally displaced right distal femur shaft fracture. Status post previous right femur intra medullary rod and proximal screw fixation Osteopenia Electronically Signed   By: Judie Petit.  Shick M.D.   On: 01/15/2015 13:04   Dg Femur, Min 2 Views Right  01/14/2015  CLINICAL DATA:  Bilateral femur pain status post unwitnessed fall. EXAM: RIGHT FEMUR 2 VIEWS COMPARISON:  None. FINDINGS: There is osteopenia. There is a displaced  oblique fracture through the mid and distal right  femoral shaft with medial and anterior displacement the fracture line extends to the distal portion of the intra medullary rod. Patient is status post intra medullary rod and screw fixation. There is no evidence of intra-articular extension. No evidence of right knee joint effusion. Vascular calcifications are noted. IMPRESSION: Oblique displaced fracture of the mid to distal right femoral shaft with medial and anterior displacement and mild overriding of the fracture fragments. Status post intra medullary rod and screw fixation of the right femur. Osteopenia. Electronically Signed   By: Ted Mcalpineobrinka  Dimitrova M.D.   On: 01/14/2015 17:09   Dg Femur Port Min 2 Views Left  01/15/2015  CLINICAL DATA:  Status post ORIF proximal left femur, acute displaced proximal left femur subtrochanteric fracture EXAM: LEFT FEMUR PORTABLE 2 VIEWS COMPARISON:  01/14/2015 FINDINGS: Left femur intra medullary rod insertion crosses the proximal left femur subtrochanteric fracture. There is improved alignment with some residual displacement at the fracture. Fixation screw through the femoral neck. Two fixation screws distally. Expected postop changes of the soft tissues. Peripheral atherosclerosis evident. IMPRESSION: Status post ORIF for a proximal left femur subtrochanteric fracture with improved alignment. Osteopenia Atherosclerosis Electronically Signed   By: Judie PetitM.  Shick M.D.   On: 01/15/2015 12:59     ASSESSMENT AND PLAN:   81 year old female with past medical history of dementia, Parkinson's disease, anxiety/depression, osteoporosis, urinary incontinence and presented to the hospital after a fall and noted to have a left hip fracture.  1 bilateral hip fractures-patient apparently had a previous right-sided hip fracture which was managed nonoperatively. She now presented with a left-sided hip fracture which was going to be managed supportively but patient started having  intractable pain and therefore was admitted to the hospital. Patient is postoperative day #14 open reduction internal fixation of the left hip. Continue pain control and care as per orthopedics.  patient is nonweightbearing at baseline.   2 Parkinson's disease-continue Sinemet.  3 dementia without behavioral disturbance-avoid benzodiazepines. Continue when necessary Haldol for agitation.  4 GERD-continue Protonix.  5 anxiety/depression-continue Zoloft  6 acute blood loss anemia-due to hip surgery. hemoglobin is low today. Patient is being transfused 1 unit PRBCs. Repeat CBC in a.m.    CODE STATUS: DO NOT RESUSCITATE  DVT Prophylaxis: Lovenox  TOTAL TIME TAKING CARE OF THIS PATIENT: 322minutes.   POSSIBLE D/C IN 1-2 days, DEPENDING ON CLINICAL CONDITION.   Whittley Carandang M.D on 01/16/2015 at 11:46 AM  Between 7am to 6pm - Pager - [phone]  After 6pm go to [email or web address] - password [email or web address] [facility]  [facility]  Office  [phone]  CC: Primary care physician; Tillman Abideichard Letvak, MD

## 2015-01-16 NOTE — Anesthesia Postprocedure Evaluation (Signed)
Anesthesia Post Note  Patient: Rachel Faulkner  Procedure(s) Performed: Procedure(s) (LRB): INTRAMEDULLARY (IM) NAIL FEMORAL (Left)  Patient location during evaluation: Nursing Unit Anesthesia Type: Spinal Level of consciousness: awake Pain management: pain level controlled Vital Signs Assessment: post-procedure vital signs reviewed and stable Respiratory status: spontaneous breathing, respiratory function stable and nonlabored ventilation Cardiovascular status: blood pressure returned to baseline and stable Postop Assessment: no headache and no signs of nausea or vomiting Anesthetic complications: no Comments: This information per Patient's RN. Pt has severe dementia.     Last Vitals:  Filed Vitals:   01/16/15 0429 01/16/15 0434  BP: 124/39 128/54  Pulse: 75   Temp: 36.9 C   Resp: 18     Last Pain:  Filed Vitals:   01/16/15 0435  PainSc: Asleep                 COOK-MARTIN,Makenleigh Crownover

## 2015-01-16 NOTE — Clinical Social Work Note (Signed)
PT had requested to know what patient's baseline might have been at [facility]. CSW spoke with Sue [facility] and was informed that patient could stand and pivot with assist and was able to propel self from wheelchair. CSW relayed this information to HitchitaEmily, with PT. York SpanielMonica Fizza Scales MSW,LCSW [phone]

## 2015-01-16 NOTE — Progress Notes (Signed)
Spoke with Dr. Tobi BastosPyreddy pt with a hemoglobin of 6.8 this morning. 1 unit of blood to be transfused.

## 2015-01-16 NOTE — Care Management (Signed)
[facility] and the plan remains for her to [facility] at [facility]. CSW will follow.

## 2015-01-16 NOTE — Progress Notes (Signed)
PT Cancellation Note  Patient Details Name: Rachel Faulkner MRN: [id number] DOB: 1914-04-01   Cancelled Treatment:    Reason Eval/Treat Not Completed: Medical issues which prohibited therapy. Patient currently with Hgb of 6.8 and due for transfusion. PT will defer mobility evaluation until patient is more medically stable and appropriate for mobility evaluation.   Kerin RansomPatrick A Akaila Rambo, PT, DPT    01/16/2015, 8:16 AM

## 2015-01-16 NOTE — Progress Notes (Signed)
Subjective:  Postoperative day 1 status post intramedullary fixation for left femur fracture. Patient has right femur fracture with an intramedullary rod already in place. Patient with dementia and unable to provide an accurate history. She appears comfortable but confused. Patient with low urine output and a hemoglobin of 6.8.  Objective:   VITALS:   Filed Vitals:   01/16/15 0429 01/16/15 0434 01/16/15 0740 01/16/15 0850  BP: 124/39 128/54 103/55 144/40  Pulse: 75  81 82  Temp: 98.4 F (36.9 C)  98.6 F (37 C) 98.3 F (36.8 C)  TempSrc: Oral  Oral Oral  Resp: 18  15 17   Height:      Weight:      SpO2: 100%  93%     PHYSICAL EXAM:  Bilateral lower extremities: Patient's legs were in a neutral alignment. There is swelling both eyes but the thigh compartments are soft and compressible. She spontaneously exiting extends her toes. Pedal pulses are palpable. It is difficult to assess sensation based on her dementia.   LABS  Results for orders placed or performed during the hospital encounter of 01/14/15 (from the past 24 hour(s))  CBC     Status: Abnormal   Collection Time: 01/16/15  4:28 AM  Result Value Ref Range   WBC 8.6 3.6 - 11.0 K/uL   RBC 2.17 (L) 3.80 - 5.20 MIL/uL   Hemoglobin 6.8 (L) 12.0 - 16.0 g/dL   HCT 95.220.1 (L) 84.135.0 - 32.447.0 %   MCV 92.6 80.0 - 100.0 fL   MCH 31.2 26.0 - 34.0 pg   MCHC 33.7 32.0 - 36.0 g/dL   RDW 40.113.4 02.711.5 - 25.314.5 %   Platelets 145 (L) 150 - 440 K/uL  Basic metabolic panel     Status: Abnormal   Collection Time: 01/16/15  4:28 AM  Result Value Ref Range   Sodium 140 135 - 145 mmol/L   Potassium 4.2 3.5 - 5.1 mmol/L   Chloride 106 101 - 111 mmol/L   CO2 27 22 - 32 mmol/L   Glucose, Bld 121 (H) 65 - 99 mg/dL   BUN 26 (H) 6 - 20 mg/dL   Creatinine, Ser 6.640.96 0.44 - 1.00 mg/dL   Calcium 7.7 (L) 8.9 - 10.3 mg/dL   GFR calc non Af Amer 48 (L) >60 mL/min   GFR calc Af Amer 55 (L) >60 mL/min   Anion gap 7 5 - 15  Type and screen [facility]     Status: None (Preliminary result)   Collection Time: 01/16/15  8:19 AM  Result Value Ref Range   ABO/RH(D) PENDING    Antibody Screen PENDING    Sample Expiration 01/19/2015     Dg Hip Operative Unilat With Pelvis Left  01/15/2015  CLINICAL DATA:  Left subtrochanteric proximal femur fracture, status post ORIF EXAM: OPERATIVE left HIP (WITH PELVIS IF PERFORMED) 8 VIEWS TECHNIQUE: Fluoroscopic spot image(s) were submitted for interpretation post-operatively. COMPARISON:  01/15/2015 FINDINGS: Spot fluoroscopic intraoperative views demonstrate intra medullary rod insertion reducing the proximal left femur subtrochanteric fracture. Proximal and distal fixation screws in place. There is improved line with residual minimal displacement of the fracture. Peripheral atherosclerosis evident. Bones are osteopenic. IMPRESSION: Status post ORIF of the left femur fracture with improved alignment. Electronically Signed   By: Judie PetitM.  Shick M.D.   On: 01/15/2015 14:19   Dg Femur Port, 1v Right  01/15/2015  CLINICAL DATA:  Right femur fracture, postop check EXAM: RIGHT FEMUR PORTABLE 1 VIEW COMPARISON:  01/14/2015 FINDINGS: bones are osteopenic. Right distal femur minimally displaced fracture again evident. Previous proximal fixation screw and right femur intra medullary rod stable in position. No interval change. Peripheral atherosclerosis noted. IMPRESSION: Stable minimally displaced right distal femur shaft fracture. Status post previous right femur intra medullary rod and proximal screw fixation Osteopenia Electronically Signed   By: Judie Petit.  Shick M.D.   On: 01/15/2015 13:04   Dg Femur, Min 2 Views Right  01/14/2015  CLINICAL DATA:  Bilateral femur pain status post unwitnessed fall. EXAM: RIGHT FEMUR 2 VIEWS COMPARISON:  None. FINDINGS: There is osteopenia. There is a displaced oblique fracture through the mid and distal right femoral shaft with medial and anterior displacement the fracture  line extends to the distal portion of the intra medullary rod. Patient is status post intra medullary rod and screw fixation. There is no evidence of intra-articular extension. No evidence of right knee joint effusion. Vascular calcifications are noted. IMPRESSION: Oblique displaced fracture of the mid to distal right femoral shaft with medial and anterior displacement and mild overriding of the fracture fragments. Status post intra medullary rod and screw fixation of the right femur. Osteopenia. Electronically Signed   By: Ted Mcalpine M.D.   On: 01/14/2015 17:09   Dg Femur Port Min 2 Views Left  01/15/2015  CLINICAL DATA:  Status post ORIF proximal left femur, acute displaced proximal left femur subtrochanteric fracture EXAM: LEFT FEMUR PORTABLE 2 VIEWS COMPARISON:  01/14/2015 FINDINGS: Left femur intra medullary rod insertion crosses the proximal left femur subtrochanteric fracture. There is improved alignment with some residual displacement at the fracture. Fixation screw through the femoral neck. Two fixation screws distally. Expected postop changes of the soft tissues. Peripheral atherosclerosis evident. IMPRESSION: Status post ORIF for a proximal left femur subtrochanteric fracture with improved alignment. Osteopenia Atherosclerosis Electronically Signed   By: Judie Petit.  Shick M.D.   On: 01/15/2015 12:59    Assessment/Plan: 1 Day Post-Op   Active Problems:   Bilateral hip fractures (HCC)  Patient is being ordered for 2 units of transfusion today. She has bilateral femur fractures which is the cause of her anemia today. Her urine output is low likely secondary to anemia as well. Patient is nonweightbearing on both lower extremity. She is nonambulatory at baseline. Patient should be monitored overnight for stabilization of her anemia improvement in her urine output.    Juanell Fairly , MD 01/16/2015, 9:58 AM

## 2015-01-17 LAB — TYPE AND SCREEN
ABO/RH(D): A NEG
Antibody Screen: NEGATIVE
Unit division: 0
Unit division: 0

## 2015-01-17 LAB — CBC
HCT: 26.9 % — ABNORMAL LOW (ref 35.0–47.0)
Hemoglobin: 9 g/dL — ABNORMAL LOW (ref 12.0–16.0)
MCH: 29.9 pg (ref 26.0–34.0)
MCHC: 33.3 g/dL (ref 32.0–36.0)
MCV: 89.8 fL (ref 80.0–100.0)
PLATELETS: 192 10*3/uL (ref 150–440)
RBC: 3 MIL/uL — ABNORMAL LOW (ref 3.80–5.20)
RDW: 15 % — AB (ref 11.5–14.5)
WBC: 11.4 10*3/uL — AB (ref 3.6–11.0)

## 2015-01-17 LAB — BASIC METABOLIC PANEL
ANION GAP: 8 (ref 5–15)
BUN: 20 mg/dL (ref 6–20)
CALCIUM: 8.2 mg/dL — AB (ref 8.9–10.3)
CO2: 25 mmol/L (ref 22–32)
Chloride: 106 mmol/L (ref 101–111)
Creatinine, Ser: 0.66 mg/dL (ref 0.44–1.00)
GFR calc Af Amer: >60 mL/min (ref >60)
GLUCOSE: 134 mg/dL — AB (ref 65–99)
Potassium: 4 mmol/L (ref 3.5–5.1)
Sodium: 139 mmol/L (ref 135–145)

## 2015-01-17 MED ORDER — OXYCODONE HCL 5 MG PO TABS: 5.0000 mg | ORAL_TABLET | ORAL | Status: DC | PRN

## 2015-01-17 MED ORDER — BISACODYL 10 MG RE SUPP: 10.0000 mg | Freq: Every day | RECTAL | Status: AC | PRN

## 2015-01-17 MED ORDER — POLYETHYLENE GLYCOL 3350 17 G PO PACK: 17.0000 g | PACK | Freq: Every day | ORAL | Status: AC | PRN

## 2015-01-17 MED ORDER — ENOXAPARIN SODIUM 30 MG/0.3ML ~~LOC~~ SOLN: SUBCUTANEOUS | Status: DC

## 2015-01-17 NOTE — Clinical Social Work Note (Signed)
[facility] to [facility]. CSW has contacted [facility] and sent discharge information. CSW has contacted patient's son and he is aware of discharge and in agreement. Patient to transport via EMS. Patient's nurse to call report.  York SpanielMonica Anneth Brunell MSW,LCSW [phone]

## 2015-01-17 NOTE — Evaluation (Signed)
Physical Therapy Evaluation Patient Details Name: Rachel Faulkner MRN: [id number] DOB: 06-Dec-1914 Today's Date: 01/17/2015   History of Present Illness  Pt is a 81yo white female with PMH: PD, dementia, anxiety, and OP. Pt sustained a fall at home [facility]) on 12/25, wherein she sustained bilateral femoral fractures. Pt already had IM rod placement on R side (4YA) hence no surgical intervention made, and pt underwent L ORIF. Pt is NWB bilaterally. Baseline includes orientation to person, standing pivot transfers with MinGuard and limited self propulsion c WC for mobility. At eval, pt has DC orders to return to [facility].   Clinical Impression  Pt is received semirecumbent in bed upon entry, awake, alert, and willing to participate as she finished drinking he rmilk. No acute distress noted. Pt is oriented to person only and pleasant, and responds to verbal commands and questions about 50% of the time. Pt is profoundly weak in BLE, unable to product any active ROM when cued aside from some very limited ankle mobility which she reports as painful on the left side. All P/ROM is performed with minimally total assistance, unable to detect whether patient is able to contribute, although she does count along with therapist with reps. O2 Sats remain stable at 92% with all activity. Patient presenting with impairment of strength, range of motion, oxygen perfusion, and activity tolerance, limiting ability to perform ADL and mobility tasks at baseline level of function. Patient will benefit from skilled intervention to address the above impairments and limitations, in order to restore to prior level of function, improve patient safety upon discharge, and to decrease falls risk. Recommending return to [facility] with available PT services as patient is able to continue to participate with and tolerate therapy.        Follow Up Recommendations  (Return to [facility] with PT services there. )     Equipment Recommendations  None recommended by PT    Recommendations for Other Services       Precautions / Restrictions Precautions Precaution Comments: No score available at eval.  Required Braces or Orthoses: Knee Immobilizer - Right Restrictions Weight Bearing Restrictions: Yes RLE Weight Bearing: Non weight bearing LLE Weight Bearing: Non weight bearing      Mobility  Bed Mobility               General bed mobility comments: Pt is appropriate for bed mobility with nursing for hygiene. Does not tolerate with PT; not therapeutic at this time.   Transfers                 General transfer comment: Pt is bilat NWB; appropriate for hoyer lift transfers with nursing only.   Ambulation/Gait             General Gait Details: Does not ambulate at baseline.   Stairs            Wheelchair Mobility    Modified Rankin (Stroke Patients Only)       Balance                                             Pertinent Vitals/Pain Pain Assessment: Faces (NPRS is not appropriate for this patient due to cognitive status. ) Faces Pain Scale: Hurts whole lot Pain Location: bilateral proximal femurs.  Pain Descriptors / Indicators: Operative site guarding Pain Intervention(s): Limited activity  within patient's tolerance;Monitored during session;Premedicated before session    Home Living Family/patient expects to be discharged to:: Assisted living               Home Equipment: Wheelchair - manual Additional Comments: Pt is poor historian; information gathered from care management.     Prior Function Level of Independence: Needs assistance   Gait / Transfers Assistance Needed: standing pivot transfers with MnGuard; WC for mobility.   ADL's / Homemaking Assistance Needed: Total care; able to self feed.         Hand Dominance        Extremity/Trunk Assessment   Upper Extremity Assessment: Defer to OT evaluation            Lower Extremity Assessment: Generalized weakness;RLE deficits/detail;LLE deficits/detail RLE Deficits / Details: Very limited A/ROM both in strength and tolerance to range (P/ROM); mild-moderate swelling throughout.  LLE Deficits / Details: Very limited A/ROM both in strength and tolerance to range (P/ROM); mild-moderate swelling throughout.      Communication   Communication: No difficulties  Cognition Arousal/Alertness: Awake/alert Behavior During Therapy: Flat affect Overall Cognitive Status: History of cognitive impairments - at baseline       Memory: Decreased recall of precautions;Decreased short-term memory              General Comments      Exercises General Exercises - Lower Extremity Ankle Circles/Pumps: PROM;Both;20 reps;Supine Short Arc Quad: PROM;Left;15 reps;Supine Hip ABduction/ADduction: PROM;Both;15 reps;Supine (very limited range, tightness in adductors, and pain. ) Straight Leg Raises: PROM;Right;15 reps;Supine Hip Flexion/Marching: PROM;Left;15 reps;Supine      Assessment/Plan    PT Assessment All further PT needs can be met in the next venue of care  PT Diagnosis Acute pain;Generalized weakness   PT Problem List Decreased strength;Decreased range of motion;Decreased activity tolerance;Decreased mobility;Pain  PT Treatment Interventions     PT Goals (Current goals can be found in the Care Plan section) Acute Rehab PT Goals PT Goal Formulation: All assessment and education complete, DC therapy    Frequency     Barriers to discharge        Co-evaluation               End of Session Equipment Utilized During Treatment: Right knee immobilizer Activity Tolerance: Patient tolerated treatment well;Patient limited by pain Patient left: in bed;with call bell/phone within reach;with bed alarm set Nurse Communication: Need for lift equipment;Mobility status;Precautions;Weight bearing status         Time: 3254-9826 PT Time Calculation  (min) (ACUTE ONLY): 20 min   Charges:   PT Evaluation $Initial PT Evaluation Tier I: 1 Procedure PT Treatments $Therapeutic Activity: 8-22 mins   PT G Codes:        Kerington Hildebrant C Feb 15, 2015, 9:40 AM  9:45 AM  Etta Grandchild, PT, DPT [place] License # [id number]

## 2015-01-17 NOTE — Discharge Summary (Signed)
[facility] -  at [facility]   PATIENT NAME: Rachel CiproLila Faulkner    MR#:  [id number]  DATE OF BIRTH:  1914-03-04  DATE OF ADMISSION:  01/14/2015 ADMITTING PHYSICIAN: Ramonita LabAruna Gouru, MD  DATE OF DISCHARGE: 01/17/2016  PRIMARY CARE PHYSICIAN: Tillman Abideichard Letvak, MD    ADMISSION DIAGNOSIS:  Hip fracture, unspecified laterality, closed, initial encounter (HCC) [S72.009A]  DISCHARGE DIAGNOSIS:  Active Problems:   Bilateral hip fractures (HCC)   SECONDARY DIAGNOSIS:   Past Medical History  Diagnosis Date  . Dementia without behavioral disturbance   . Parkinson disease (HCC)   . Anxiety   . Depression   . Arthritis   . Osteoporosis   . Urinary incontinence     HOSPITAL COURSE:    81 year old female with past medical history of dementia, Parkinson's disease, anxiety/depression, osteoporosis, urinary incontinence and presented to the hospital after a fall and noted to have a left hip fracture.  1 Bilateral hip fractures-patient has had a previous right-sided hip fracture which was managed nonoperatively. She now presented with a left-sided hip fracture which was going to be managed supportively but patient started having intractable pain and therefore was admitted to the hospital. Patient is postoperative day #2 open reduction internal fixation of the left hip. Continue pain control and Lovenox for DVT prophylaxiis for 10 more days orthopedics. patient is nonweightbearing at baseline.   2 Parkinson's disease-continue Sinemet.  3 dementia without behavioral disturbance-avoid benzodiazepines. Continue when necessary Haldol for agitation.  4 GERD-continue Protonix.  5 anxiety/depression-continue Zoloft  6 acute blood loss anemia-due to hip surgery. Patient was transfused 2 unit PRBCs.  Hemoglobin at 9 this am.    DISCHARGE CONDITIONS AND DIET:  Stable condition regular diet  CONSULTS OBTAINED:  Treatment Team:  Juanell FairlyKevin Krasinski, MD   DRUG  ALLERGIES:   Allergies  Allergen Reactions  . Codeine Other (See Comments)    Reaction:  Unknown   . Sulfa Antibiotics Other (See Comments)    Reaction:  Unknown     DISCHARGE MEDICATIONS:   Current Discharge Medication List    START taking these medications   Details  bisacodyl (DULCOLAX) 10 MG suppository Place 1 suppository (10 mg total) rectally daily as needed for moderate constipation. Qty: 12 suppository, Refills: 0    enoxaparin (LOVENOX) 30 MG/0.3ML injection 30 mg [place] daily for 10 days then STOP Qty: 30 mL, Refills: 0    oxyCODONE (OXY IR/ROXICODONE) 5 MG immediate release tablet Take 1-2 tablets (5-10 mg total) by mouth every 4 (four) hours as needed for breakthrough pain ((for MODERATE breakthrough pain)). Qty: 30 tablet, Refills: 0    polyethylene glycol (MIRALAX / GLYCOLAX) packet Take 17 g by mouth daily as needed for mild constipation. Qty: 14 each, Refills: 0      CONTINUE these medications which have NOT CHANGED   Details  acetaminophen (TYLENOL ARTHRITIS PAIN) 650 MG CR tablet Take 650 mg by mouth 2 (two) times daily.     acetaminophen (TYLENOL) 325 MG tablet Take 650 mg by mouth 3 (three) times daily as needed for mild pain.    albuterol (PROVENTIL HFA;VENTOLIN HFA) 108 (90 BASE) MCG/ACT inhaler Inhale 2 puffs into the lungs every 4 (four) hours as needed for wheezing or shortness of breath.    alum & mag hydroxide-simeth (MAALOX/MYLANTA) 200-200-20 MG/5ML suspension Take 30 mLs by mouth every 4 (four) hours as needed for indigestion, heartburn or flatulence.    aspirin 81 MG chewable tablet Chew 81 mg by mouth daily.  carbidopa-levodopa (SINEMET) 25-100 MG per tablet Take 1 tablet by mouth 4 (four) times daily.      furosemide (LASIX) 20 MG tablet Take 20 mg by mouth daily.     HYDROcodone-acetaminophen (NORCO/VICODIN) 5-325 MG per tablet Take 1 tablet by mouth every 4 (four) hours as needed (pain). Qty: 30 tablet    loratadine (CLARITIN) 10 MG  tablet Take 10 mg by mouth daily.    Multiple Vitamin (MULTIVITAMIN WITH MINERALS) TABS tablet Take 1 tablet by mouth at bedtime.    Multiple Vitamins-Minerals (PRESERVISION AREDS 2 PO) Take 1 tablet by mouth daily at 12 noon.    pramipexole (MIRAPEX) 0.25 MG tablet Take 0.25 mg by mouth 3 (three) times daily.      sertraline (ZOLOFT) 25 MG tablet Take 25 mg by mouth at bedtime.     Vitamin D, Ergocalciferol, (DRISDOL) 50000 UNITS CAPS capsule Take 50,000 Units by mouth every 30 (thirty) days. Pt takes on the 24th of every month.              Today   CHIEF COMPLAINT:   No issues overnight   VITAL SIGNS:  Blood pressure 106/88, pulse 86, temperature 98.1 F (36.7 C), temperature source Oral, resp. rate 18, height  (1.549 m), weight 60.328 kg (133 lb), SpO2 93 %.   REVIEW OF SYSTEMS:  Review of Systems  Constitutional: Negative for fever, chills and malaise/fatigue.  HENT: Negative for sore throat.   Eyes: Negative for blurred vision.  Respiratory: Negative for cough, hemoptysis, shortness of breath and wheezing.   Cardiovascular: Negative for chest pain, palpitations and leg swelling.  Gastrointestinal: Negative for nausea, vomiting, abdominal pain, diarrhea and blood in stool.  Genitourinary: Negative for dysuria.  Musculoskeletal: Negative for back pain.  Neurological: Negative for dizziness, tremors and headaches.  Endo/Heme/Allergies: Does not bruise/bleed easily.     PHYSICAL EXAMINATION:  GENERAL:  81 y.o.-year-old patient lying in the bed with no acute distress.  NECK:  Supple, no jugular venous distention. No thyroid enlargement, no tenderness.  LUNGS: Normal breath sounds bilaterally, no wheezing, rales,rhonchi  No use of accessory muscles of respiration.  CARDIOVASCULAR: S1, S2 normal. No murmurs, rubs, or gallops.  ABDOMEN: Soft, non-tender, non-distended. Bowel sounds present. No organomegaly or mass.  EXTREMITIES: No pedal edema, cyanosis, or  clubbing.  PSYCHIATRIC: The patient is alert and oriented x name.  SKIN: No obvious rash, lesion, or ulcer.   DATA REVIEW:   CBC  Recent Labs Lab 01/17/15 0454  WBC 11.4*  HGB 9.0*  HCT 26.9*  PLT 192    Chemistries   Recent Labs Lab 01/17/15 0454  NA 139  K 4.0  CL 106  CO2 25  GLUCOSE 134*  BUN 20  CREATININE 0.66  CALCIUM 8.2*    Cardiac Enzymes No results for input(s): TROPONINI in the last 168 hours.  Microbiology Results  @  RADIOLOGY:  Dg Hip Operative Unilat With Pelvis Left  01/15/2015  CLINICAL DATA:  Left subtrochanteric proximal femur fracture, status post ORIF EXAM: OPERATIVE left HIP (WITH PELVIS IF PERFORMED) 8 VIEWS TECHNIQUE: Fluoroscopic spot image(s) were submitted for interpretation post-operatively. COMPARISON:  01/15/2015 FINDINGS: Spot fluoroscopic intraoperative views demonstrate intra medullary rod insertion reducing the proximal left femur subtrochanteric fracture. Proximal and distal fixation screws in place. There is improved line with residual minimal displacement of the fracture. Peripheral atherosclerosis evident. Bones are osteopenic. IMPRESSION: Status post ORIF of the left femur fracture with improved alignment. Electronically Signed   By: Judie Petit.  Shick M.D.   On: 01/15/2015 14:19   Dg Femur Port, 1v Right  01/15/2015  CLINICAL DATA:  Right femur fracture, postop check EXAM: RIGHT FEMUR PORTABLE 1 VIEW COMPARISON:  01/14/2015 FINDINGS: bones are osteopenic. Right distal femur minimally displaced fracture again evident. Previous proximal fixation screw and right femur intra medullary rod stable in position. No interval change. Peripheral atherosclerosis noted. IMPRESSION: Stable minimally displaced right distal femur shaft fracture. Status post previous right femur intra medullary rod and proximal screw fixation Osteopenia Electronically Signed   By: Judie Petit.  Shick M.D.   On: 01/15/2015 13:04   Dg Femur Port Min 2 Views  Left  01/15/2015  CLINICAL DATA:  Status post ORIF proximal left femur, acute displaced proximal left femur subtrochanteric fracture EXAM: LEFT FEMUR PORTABLE 2 VIEWS COMPARISON:  01/14/2015 FINDINGS: Left femur intra medullary rod insertion crosses the proximal left femur subtrochanteric fracture. There is improved alignment with some residual displacement at the fracture. Fixation screw through the femoral neck. Two fixation screws distally. Expected postop changes of the soft tissues. Peripheral atherosclerosis evident. IMPRESSION: Status post ORIF for a proximal left femur subtrochanteric fracture with improved alignment. Osteopenia Atherosclerosis Electronically Signed   By: Judie Petit.  Shick M.D.   On: 01/15/2015 12:59      Management plans discussed with the patient and she is in agreement. Stable for discharge SNF  Patient should follow up with ORTHO in 10 days  CODE STATUS:     Code Status Orders        Start     Ordered   01/15/15 1321  Do not attempt resuscitation (DNR)   Continuous    Question Answer Comment  In the event of cardiac or respiratory ARREST Do not call a "code blue"   In the event of cardiac or respiratory ARREST Do not perform Intubation, CPR, defibrillation or ACLS   In the event of cardiac or respiratory ARREST Use medication by any route, position, wound care, and other measures to relive pain and suffering. May use oxygen, suction and manual treatment of airway obstruction as needed for comfort.      01/15/15 1320    Advance Directive Documentation        Most Recent Value   Type of Advance Directive  Out of facility DNR (pink MOST or yellow form)   Pre-existing out of facility DNR order (yellow form or pink MOST form)  Yellow form placed in chart (order not valid for inpatient use)   "MOST" Form in Place?        TOTAL TIME TAKING CARE OF THIS PATIENT: 35 minutes.    Note: This dictation was prepared with Dragon dictation along with smaller phrase  technology. Any transcriptional errors that result from this process are unintentional.  Errika Narvaiz M.D on 01/17/2015 at 8:04 AM  Between 7am to 6pm - Pager - [phone] After 6pm go to [email or web address] - password [email or web address] [facility]  [place] [facility]  Office  [phone]  CC: Primary care physician; Tillman Abide, MD

## 2015-01-17 NOTE — Progress Notes (Signed)
Subjective:  Patient discharged to nursing facility this AM.  Hemoglobin 9.0 today after transfusions. Vital signs stable.  Objective:   VITALS:   Filed Vitals:   01/17/15 0981 01/17/15 0928 01/17/15 0949 01/17/15 1137  BP: 163/56  155/60 142/48  Pulse:   84 80  Temp:   98 F (36.7 C) 99.5 F (37.5 C)  TempSrc:   Oral Oral  Resp:   18 18  Height:      Weight:      SpO2:  92% 92% 93%    PHYSICAL EXAM:  Patient discharged to nursing home before I examined her.  LABS  Results for orders placed or performed during the hospital encounter of 01/14/15 (from the past 24 hour(s))  Prepare RBC     Status: None   Collection Time: 01/16/15  1:30 PM  Result Value Ref Range   Order Confirmation ORDER PROCESSED BY BLOOD BANK   CBC     Status: Abnormal   Collection Time: 01/17/15  4:54 AM  Result Value Ref Range   WBC 11.4 (H) 3.6 - 11.0 K/uL   RBC 3.00 (L) 3.80 - 5.20 MIL/uL   Hemoglobin 9.0 (L) 12.0 - 16.0 g/dL   HCT 19.1 (L) 47.8 - 29.5 %   MCV 89.8 80.0 - 100.0 fL   MCH 29.9 26.0 - 34.0 pg   MCHC 33.3 32.0 - 36.0 g/dL   RDW 62.1 (H) 30.8 - 65.7 %   Platelets 192 150 - 440 K/uL  Basic metabolic panel     Status: Abnormal   Collection Time: 01/17/15  4:54 AM  Result Value Ref Range   Sodium 139 135 - 145 mmol/L   Potassium 4.0 3.5 - 5.1 mmol/L   Chloride 106 101 - 111 mmol/L   CO2 25 22 - 32 mmol/L   Glucose, Bld 134 (H) 65 - 99 mg/dL   BUN 20 6 - 20 mg/dL   Creatinine, Ser 8.46 0.44 - 1.00 mg/dL   Calcium 8.2 (L) 8.9 - 10.3 mg/dL   GFR calc non Af Amer >60 >60 mL/min   GFR calc Af Amer >60 >60 mL/min   Anion gap 8 5 - 15    Dg Femur Port, 1v Right  01/15/2015  CLINICAL DATA:  Right femur fracture, postop check EXAM: RIGHT FEMUR PORTABLE 1 VIEW COMPARISON:  01/14/2015 FINDINGS: bones are osteopenic. Right distal femur minimally displaced fracture again evident. Previous proximal fixation screw and right femur intra medullary rod stable in position. No interval  change. Peripheral atherosclerosis noted. IMPRESSION: Stable minimally displaced right distal femur shaft fracture. Status post previous right femur intra medullary rod and proximal screw fixation Osteopenia Electronically Signed   By: Judie Petit.  Shick M.D.   On: 01/15/2015 13:04   Dg Femur Port Min 2 Views Left  01/15/2015  CLINICAL DATA:  Status post ORIF proximal left femur, acute displaced proximal left femur subtrochanteric fracture EXAM: LEFT FEMUR PORTABLE 2 VIEWS COMPARISON:  01/14/2015 FINDINGS: Left femur intra medullary rod insertion crosses the proximal left femur subtrochanteric fracture. There is improved alignment with some residual displacement at the fracture. Fixation screw through the femoral neck. Two fixation screws distally. Expected postop changes of the soft tissues. Peripheral atherosclerosis evident. IMPRESSION: Status post ORIF for a proximal left femur subtrochanteric fracture with improved alignment. Osteopenia Atherosclerosis Electronically Signed   By: Judie Petit.  Shick M.D.   On: 01/15/2015 12:59    Assessment/Plan: 2 Days Post-Op   Active Problems:   Bilateral hip fractures (HCC)  Patient will be NWB on both lower extremities due to bilateral femur fractures.  Knee immobilizer to continue on right lower extremity.  Follow up in 2 weeks in my office.    Juanell FairlyKRASINSKI, Paton Crum , MD 01/17/2015, 12:49 PM

## 2015-01-17 NOTE — Progress Notes (Signed)
Called [facility] and gave report to Dorann LodgeShelia Wheeler, LPN.  Patient currently eating breakfast.  Will call for transport after she finished.

## 2015-01-18 DIAGNOSIS — S7291XA Unspecified fracture of right femur, initial encounter for closed fracture: Secondary | ICD-10-CM | POA: Diagnosis not present

## 2015-01-18 DIAGNOSIS — S7292XA Unspecified fracture of left femur, initial encounter for closed fracture: Secondary | ICD-10-CM | POA: Diagnosis not present

## 2015-01-26 DIAGNOSIS — G3183 Dementia with Lewy bodies: Secondary | ICD-10-CM | POA: Diagnosis not present

## 2015-01-26 DIAGNOSIS — I872 Venous insufficiency (chronic) (peripheral): Secondary | ICD-10-CM | POA: Diagnosis not present

## 2015-01-26 DIAGNOSIS — S7290XA Unspecified fracture of unspecified femur, initial encounter for closed fracture: Secondary | ICD-10-CM | POA: Diagnosis not present

## 2015-01-26 DIAGNOSIS — G7000 Myasthenia gravis without (acute) exacerbation: Secondary | ICD-10-CM | POA: Diagnosis not present

## 2015-01-26 DIAGNOSIS — F39 Unspecified mood [affective] disorder: Secondary | ICD-10-CM

## 2015-02-02 DIAGNOSIS — S81802A Unspecified open wound, left lower leg, initial encounter: Secondary | ICD-10-CM | POA: Diagnosis not present

## 2015-03-05 ENCOUNTER — Telehealth: Payer: Self-pay | Admitting: *Deleted

## 2015-03-05 DIAGNOSIS — S8002XA Contusion of left knee, initial encounter: Secondary | ICD-10-CM | POA: Diagnosis not present

## 2015-03-05 NOTE — Telephone Encounter (Signed)
[facility] Night - Client TELEPHONE ADVICE RECORD [facility] Patient Name: Rachel Faulkner Gender: Female DOB: 12/08/1914 Age: 82 Y 10 M 20 D Return Phone Number: Address: City/State/Zip: [place] [facility] [place] Night - Client Client Site [facility] [place] - Night Physician Alphonsus Sias, Richard Contact Type Fax Call Type Triage / Clinical Caller Name Seth Bake Relationship To Patient Provider Return Phone Number Please choose phone number Chief Complaint Fall - No Injury Initial Comment Caller states the patient fell today. CB#[phone] or [phone] Translation No No Triage Reason Other Nurse Assessment Nurse: Izola Price, RN, Cala Bradford Date/Time Lamount Cohen Time): 03/02/2015 8:16:31 PM Confirm and document reason for call. If symptomatic, describe symptoms. You must click the next button to save text entered. ---caller states pt fell today on both knees. One of which she has had a knee replacement on. Both are bruised but [facility] nurse was unable to tell if any damage was done due to edema due to surgery. Pt states that knee that pt hasn't had replaced is the one that hurts the worst. Pt isn't able to walk. She can stand and this is all she can do. Has the patient traveled out of the country within the last 30 days? ---Not Applicable Does the patient have any new or worsening symptoms? ---Yes Will a triage be completed? ---No Select reason for no triage. ---Other Please document clinical information provided and list any resource used. ---[facility] nurse has assessed pt and is wondering if a portable x ray can be done. Nurse will call on call to see if this is needed. Guidelines Guideline Title Affirmed Question Affirmed Notes Nurse Date/Time (Eastern Time) Disp. Time Lamount Cohen Time) Disposition Final User 03/02/2015 8:15:37 PM Send To RN Personal Francesca Oman 03/02/2015 8:28:10 PM Called On-Call Provider  Izola Price, RN, Cala Bradford 03/02/2015 8:29:35 PM Clinical Call Yes Izola Price, RN, Cala Bradford PLEASE NOTE: All timestamps contained within this report are represented as [place] Standard Time. CONFIDENTIALTY NOTICE: This fax transmission is intended only for the addressee. It contains information that is legally privileged, confidential or otherwise protected from use or disclosure. If you are not the intended recipient, you are strictly prohibited from reviewing, disclosing, copying using or disseminating any of this information or taking any action in reliance on or regarding this information. If you have received this fax in error, please notify us immediately by telephone so that we can arrange for its return to [place]. Phone: [phone], Toll-Free: [phone], Fax: [phone] Page: 2 of 2 Call Id: [id number] Paging DoctorName Phone DateTime Result/Outcome Message Type Notes Berniece Andreas [phone] 03/02/2015 8:28:10 PM Called On Call Provider - Reached Doctor Paged Berniece Andreas 03/02/2015 8:29:25 PM Spoke with On Call - General Message Result Dr. Fabian Sharp stated that if pt is in a lot of pain or if [facility] nurse feels that a portable xray is needed, one can be ordered. Nurse notified [facility] nurse, Melva, and she stated that pt was hurt too bad and they will wait until Monday to see how pt is doing and if she still needs it, it can be ordered at this time.

## 2015-03-05 NOTE — Telephone Encounter (Signed)
Left message on answering machine at home number to call back. 

## 2015-03-05 NOTE — Telephone Encounter (Signed)
I checked her today. Just has contusion on left knee--she reports no pain today

## 2015-03-05 NOTE — Telephone Encounter (Signed)
[facility] Night - Client TELEPHONE ADVICE RECORD [facility] Medical Call Center Patient Name: [name] Gender: Female DOB: March 09, 1914 Age: 82 Y 10 M 20 D Return Phone Number: Address: City/State/Zip: [place] [facility] Night - Client Client Site [place] [facility] [place] - Night Physician Tillman Abide Contact Type Call Call Type Page Only Caller Name Violeta Gelinas Relationship To Patient Provider Is this call to report lab results? No Return Phone Number Please choose phone number Initial Comment Caller states she is Violeta Gelinas calling from [facility] - pt fell and hurt left knee. Wanting to get an order for xray CB# [phone] Translation No Nurse Assessment Guidelines Guideline Title Affirmed Question Affirmed Notes Nurse Date/Time (Eastern Time) Disp. Time Lamount Cohen Time) Disposition Final User 03/03/2015 11:35:30 AM Paged On Call back to [facility] Rogue Bussing 03/03/2015 12:58:46 PM Send to [facility] Paging Jamesetta Orleans, RN, Miranda 03/03/2015 1:05:44 PM Called On-Call Provider Thibou, Jasmine 03/03/2015 1:06:00 PM Page Completed Yes Sherre Poot [facility] Phone DateTime Result/Outcome Message Type Notes Berniece Andreas [phone] 03/03/2015 11:35:30 AM Called On Call Provider - Left Message Doctor Paged Berniece Andreas [phone] 03/03/2015 1:05:44 PM Called On Call Provider - Reached Doctor Paged Berniece Andreas 03/03/2015 1:05:50 PM Spoke with On Call - General Message Result

## 2015-03-05 NOTE — Telephone Encounter (Signed)
PLEASE NOTE: All timestamps contained within this report are represented as [place] Standard Time. CONFIDENTIALTY NOTICE: This fax transmission is intended only for the addressee. It contains information that is legally privileged, confidential or otherwise protected from use or disclosure. If you are not the intended recipient, you are strictly prohibited from reviewing, disclosing, copying using or disseminating any of this information or taking any action in reliance on or regarding this information. If you have received this fax in error, please notify us immediately by telephone so that we can arrange for its return to [place]. Phone: [phone], Toll-Free: [phone], Fax: [phone] Page: 1 of 1 Call Id: [id number] [facility] Day - Client Nonclinical Telephone Record [facility] Medical Call Center Client [place] [facility] Day - Client Client Site [place] [facility] [place] - Day Physician Tillman Abide Contact Type Call Who Is Calling Physician / Provider / Hospital Call Type Provider Call Message Only Initial Comment Caller states fell and hurt both knees. Having trouble bending knees, knee is bruising. Additional Comment Patient is Environmental consultant Call Closed By: Assunta Curtis Transaction Date/Time: 03/02/2015 6:19:32 PM (ET)

## 2015-03-22 DIAGNOSIS — G3183 Dementia with Lewy bodies: Secondary | ICD-10-CM | POA: Diagnosis not present

## 2015-03-22 DIAGNOSIS — F39 Unspecified mood [affective] disorder: Secondary | ICD-10-CM | POA: Diagnosis not present

## 2015-03-22 DIAGNOSIS — G20 Parkinson's disease: Secondary | ICD-10-CM | POA: Diagnosis not present

## 2015-03-22 DIAGNOSIS — I872 Venous insufficiency (chronic) (peripheral): Secondary | ICD-10-CM | POA: Diagnosis not present

## 2015-03-26 ENCOUNTER — Telehealth: Payer: Self-pay

## 2015-03-26 NOTE — Telephone Encounter (Signed)
X-ray looked okay and she is better No action needed

## 2015-03-26 NOTE — Telephone Encounter (Signed)
PLEASE NOTE: All timestamps contained within this report are represented as [place] Standard Time. CONFIDENTIALTY NOTICE: This fax transmission is intended only for the addressee. It contains information that is legally privileged, confidential or otherwise protected from use or disclosure. If you are not the intended recipient, you are strictly prohibited from reviewing, disclosing, copying using or disseminating any of this information or taking any action in reliance on or regarding this information. If you have received this fax in error, please notify us immediately by telephone so that we can arrange for its return to us. Phone: [phone], Toll-Free: [phone], Fax: [phone] Page: 1 of 1 Call Id: [id number] [facility] Night - Client Nonclinical Telephone Record [facility] Client [place] [facility] Night - Client [facility] [place] [facility] - Night Physician Tillman AbideLetvak, Richard Contact Type Call Who Is Calling Physician / Provider / Hospital Call Type Provider Call [facility] Page Now Reason for Call Request to speak to Physician Initial Comment Caller is Violeta Gelinasikita with [facility] - needs the on call paged for a drs order for an xray CB# [phone] Additional Comment Patient Name Rachel Faulkner Patient DOB 10/25/1914 Requesting Provider [facility] Phys[facility] Number [phone] Facility Name [facility] Paging [facility] Phone DateTime Result/Outcome Message Type Notes Kerby NoraBedsole, Amy [phone] 03/25/2015 7:36:28 AM Paged On Call to Other Provider Doctor [facility]aged [facility]: Please call Rachel Faulkner @ [phone] regarding Rachel Faulkner. Kerby NoraBedsole, Amy 03/25/2015 7:37:22 AM Paged On Call to Another Provider Message Result Call Closed By: Gasper Sellsavid Partin Transaction Date/Time: 03/25/2015 7:21:59 AM (ET)

## 2015-05-23 DIAGNOSIS — F39 Unspecified mood [affective] disorder: Secondary | ICD-10-CM | POA: Diagnosis not present

## 2015-05-23 DIAGNOSIS — G3183 Dementia with Lewy bodies: Secondary | ICD-10-CM | POA: Diagnosis not present

## 2015-05-23 DIAGNOSIS — I872 Venous insufficiency (chronic) (peripheral): Secondary | ICD-10-CM | POA: Diagnosis not present

## 2015-05-23 DIAGNOSIS — G20 Parkinson's disease: Secondary | ICD-10-CM | POA: Diagnosis not present

## 2015-07-18 DIAGNOSIS — H6123 Impacted cerumen, bilateral: Secondary | ICD-10-CM | POA: Diagnosis not present

## 2015-07-23 DIAGNOSIS — G20 Parkinson's disease: Secondary | ICD-10-CM | POA: Diagnosis not present

## 2015-07-23 DIAGNOSIS — G3183 Dementia with Lewy bodies: Secondary | ICD-10-CM | POA: Diagnosis not present

## 2015-07-23 DIAGNOSIS — F39 Unspecified mood [affective] disorder: Secondary | ICD-10-CM | POA: Diagnosis not present

## 2015-07-23 DIAGNOSIS — I872 Venous insufficiency (chronic) (peripheral): Secondary | ICD-10-CM | POA: Diagnosis not present

## 2015-09-26 DIAGNOSIS — G20 Parkinson's disease: Secondary | ICD-10-CM | POA: Diagnosis not present

## 2015-10-16 ENCOUNTER — Inpatient Hospital Stay
Admission: AD | Admit: 2015-10-16 | Discharge: 2015-10-17 | DRG: 563 | Disposition: A | Discharge status: Other Acute Inpatient Hospital | Payer: Medicare (Managed Care) | Source: Ambulatory Visit | Attending: Orthopedic Surgery | Admitting: Orthopedic Surgery

## 2015-10-16 ENCOUNTER — Other Ambulatory Visit: Payer: Self-pay | Admitting: Orthopedic Surgery

## 2015-10-16 DIAGNOSIS — Z885 Allergy status to narcotic agent status: Secondary | ICD-10-CM

## 2015-10-16 DIAGNOSIS — G20 Parkinson's disease: Secondary | ICD-10-CM | POA: Diagnosis present

## 2015-10-16 DIAGNOSIS — F329 Major depressive disorder, single episode, unspecified: Secondary | ICD-10-CM | POA: Diagnosis present

## 2015-10-16 DIAGNOSIS — W19XXXA Unspecified fall, initial encounter: Secondary | ICD-10-CM | POA: Diagnosis present

## 2015-10-16 DIAGNOSIS — IMO0002 Reserved for concepts with insufficient information to code with codable children: Secondary | ICD-10-CM | POA: Diagnosis present

## 2015-10-16 DIAGNOSIS — F0280 Dementia in other diseases classified elsewhere without behavioral disturbance: Secondary | ICD-10-CM | POA: Diagnosis present

## 2015-10-16 DIAGNOSIS — S8290XA Unspecified fracture of unspecified lower leg, initial encounter for closed fracture: Secondary | ICD-10-CM

## 2015-10-16 DIAGNOSIS — F419 Anxiety disorder, unspecified: Secondary | ICD-10-CM | POA: Diagnosis present

## 2015-10-16 DIAGNOSIS — Z882 Allergy status to sulfonamides status: Secondary | ICD-10-CM | POA: Diagnosis not present

## 2015-10-16 DIAGNOSIS — S82832A Other fracture of upper and lower end of left fibula, initial encounter for closed fracture: Secondary | ICD-10-CM | POA: Diagnosis present

## 2015-10-16 DIAGNOSIS — Z79899 Other long term (current) drug therapy: Secondary | ICD-10-CM | POA: Diagnosis not present

## 2015-10-16 DIAGNOSIS — M810 Age-related osteoporosis without current pathological fracture: Secondary | ICD-10-CM | POA: Diagnosis present

## 2015-10-16 DIAGNOSIS — Z79891 Long term (current) use of opiate analgesic: Secondary | ICD-10-CM

## 2015-10-16 DIAGNOSIS — Z87891 Personal history of nicotine dependence: Secondary | ICD-10-CM

## 2015-10-16 DIAGNOSIS — S82102A Unspecified fracture of upper end of left tibia, initial encounter for closed fracture: Principal | ICD-10-CM | POA: Diagnosis present

## 2015-10-16 DIAGNOSIS — M1990 Unspecified osteoarthritis, unspecified site: Secondary | ICD-10-CM | POA: Diagnosis present

## 2015-10-16 LAB — COMPREHENSIVE METABOLIC PANEL
ALBUMIN: 3.4 g/dL — AB (ref 3.5–5.0)
ALK PHOS: 114 U/L (ref 38–126)
ALT: 10 U/L — AB (ref 14–54)
ANION GAP: 8 (ref 5–15)
AST: 29 U/L (ref 15–41)
BUN: 29 mg/dL — ABNORMAL HIGH (ref 6–20)
CALCIUM: 9 mg/dL (ref 8.9–10.3)
CHLORIDE: 100 mmol/L — AB (ref 101–111)
CO2: 30 mmol/L (ref 22–32)
CREATININE: 0.65 mg/dL (ref 0.44–1.00)
GFR calc non Af Amer: >60 mL/min (ref >60)
GLUCOSE: 102 mg/dL — AB (ref 65–99)
Potassium: 4.2 mmol/L (ref 3.5–5.1)
SODIUM: 138 mmol/L (ref 135–145)
Total Bilirubin: 0.7 mg/dL (ref 0.3–1.2)
Total Protein: 7.6 g/dL (ref 6.5–8.1)

## 2015-10-16 LAB — TYPE AND SCREEN
ABO/RH(D): A NEG
Antibody Screen: NEGATIVE

## 2015-10-16 LAB — CBC WITH DIFFERENTIAL/PLATELET
BASOS PCT: 1 %
Basophils Absolute: 0 10*3/uL (ref 0–0.1)
EOS ABS: 0.1 10*3/uL (ref 0–0.7)
EOS PCT: 1 %
HCT: 33.8 % — ABNORMAL LOW (ref 35.0–47.0)
HEMOGLOBIN: 11.6 g/dL — AB (ref 12.0–16.0)
Lymphocytes Relative: 12 %
Lymphs Abs: 1.2 10*3/uL (ref 1.0–3.6)
MCH: 31.4 pg (ref 26.0–34.0)
MCHC: 34.4 g/dL (ref 32.0–36.0)
MCV: 91.4 fL (ref 80.0–100.0)
Monocytes Absolute: 0.6 10*3/uL (ref 0.2–0.9)
Monocytes Relative: 6 %
NEUTROS PCT: 80 %
Neutro Abs: 8 10*3/uL — ABNORMAL HIGH (ref 1.4–6.5)
PLATELETS: 310 10*3/uL (ref 150–440)
RBC: 3.7 MIL/uL — AB (ref 3.80–5.20)
RDW: 12.5 % (ref 11.5–14.5)
WBC: 10 10*3/uL (ref 3.6–11.0)

## 2015-10-16 LAB — PROTIME-INR
INR: 0.99
Prothrombin Time: 13.1 seconds (ref 11.4–15.2)

## 2015-10-16 LAB — APTT: APTT: 24 s (ref 24–36)

## 2015-10-16 LAB — MRSA PCR SCREENING: MRSA BY PCR: NEGATIVE

## 2015-10-16 MED ORDER — SERTRALINE HCL 50 MG PO TABS
25.0000 mg | ORAL_TABLET | Freq: Every day | ORAL | Status: DC
  Administered 2015-10-16: 25 mg via ORAL
  Filled 2015-10-16: qty 1

## 2015-10-16 MED ORDER — SODIUM CHLORIDE 0.9 % IV SOLN
INTRAVENOUS | Status: DC
  Administered 2015-10-16: 15:00:00 via INTRAVENOUS

## 2015-10-16 MED ORDER — METHOCARBAMOL 500 MG PO TABS: 500.0000 mg | ORAL_TABLET | Freq: Four times a day (QID) | ORAL | Status: DC | PRN

## 2015-10-16 MED ORDER — OXYCODONE HCL 5 MG PO TABS
5.0000 mg | ORAL_TABLET | ORAL | Status: DC | PRN
  Administered 2015-10-16 – 2015-10-17 (×2): 10 mg via ORAL
  Administered 2015-10-17: 5 mg via ORAL
  Filled 2015-10-16: qty 1
  Filled 2015-10-16 (×2): qty 2

## 2015-10-16 MED ORDER — MAGNESIUM HYDROXIDE 400 MG/5ML PO SUSP: 30.0000 mL | Freq: Every day | ORAL | Status: DC | PRN

## 2015-10-16 MED ORDER — DEXTROSE 5 % IV SOLN
500.0000 mg | Freq: Four times a day (QID) | INTRAVENOUS | Status: DC | PRN
  Filled 2015-10-16: qty 5

## 2015-10-16 MED ORDER — MORPHINE SULFATE (PF) 2 MG/ML IV SOLN
2.0000 mg | INTRAVENOUS | Status: DC | PRN
  Administered 2015-10-16 – 2015-10-17 (×4): 2 mg via INTRAVENOUS
  Filled 2015-10-16 (×4): qty 1

## 2015-10-16 MED ORDER — PRAMIPEXOLE DIHYDROCHLORIDE 0.25 MG PO TABS
0.2500 mg | ORAL_TABLET | Freq: Three times a day (TID) | ORAL | Status: DC
  Administered 2015-10-16 – 2015-10-17 (×3): 0.25 mg via ORAL
  Filled 2015-10-16 (×3): qty 1

## 2015-10-16 MED ORDER — ORAL CARE MOUTH RINSE
15.0000 mL | Freq: Two times a day (BID) | OROMUCOSAL | Status: DC
  Administered 2015-10-16 (×2): 15 mL via OROMUCOSAL

## 2015-10-16 MED ORDER — DOCUSATE SODIUM 100 MG PO CAPS
100.0000 mg | ORAL_CAPSULE | Freq: Two times a day (BID) | ORAL | Status: DC
  Administered 2015-10-16 – 2015-10-17 (×2): 100 mg via ORAL
  Filled 2015-10-16 (×2): qty 1

## 2015-10-16 MED ORDER — MAGNESIUM CITRATE PO SOLN
1.0000 | Freq: Once | ORAL | Status: DC | PRN
  Filled 2015-10-16: qty 296

## 2015-10-16 MED ORDER — CARBIDOPA-LEVODOPA 25-100 MG PO TABS
1.0000 | ORAL_TABLET | Freq: Four times a day (QID) | ORAL | Status: DC
  Administered 2015-10-16 – 2015-10-17 (×3): 1 via ORAL
  Filled 2015-10-16 (×3): qty 1

## 2015-10-16 NOTE — H&P (Signed)
H&P  Chief Complaint: Left tibia fracture status post fall  HPI: Rachel Faulkner is a 82 y.o. female who presented my office today with a complaint of left leg pain, swelling and ecchymosis status post fall on Friday 10/12/15 after a fall from a [place] lift at her nursing facility.  Patient is seen in clinic today with her family as well as a Research scientist (physical sciences) from her nursing facility. According to the family the patient is not ambulatory. Patient complains of left leg pain.  I am familiar with this patient as I previously performed an intramedullary fixation for left intertrochanteric hip fracture on her on 01/15/2015.  Past Medical History:  Diagnosis Date  . Anxiety   . Arthritis   . Dementia without behavioral disturbance   . Depression   . Osteoporosis   . Parkinson disease (HCC)   . Urinary incontinence    Past Surgical History:  Procedure Laterality Date  . APPENDECTOMY    . COLONOSCOPY W/ POLYPECTOMY  ?1980's  . FEMUR IM NAIL Left 01/15/2015   Procedure: INTRAMEDULLARY (IM) NAIL FEMORAL;  Surgeon: Juanell Fairly, MD;  Location: [facility] ORS;  Service: Orthopedics;  Laterality: Left;  femur  . TONSILLECTOMY     Social History   Social History  . Marital status: Widowed    Spouse name: N/A  . Number of children: 3  . Years of education: N/A   Occupational History  . homemaker    Social History Main Topics  . Smoking status: Former Smoker    Years: 2.00  . Smokeless tobacco: Former Neurosurgeon  . Alcohol use No     Comment: did drink a little in distant past  . Drug use: No  . Sexual activity: No   Other Topics Concern  . None   Social History Narrative   Divorced then widowed   Moved here from [place] --was in assisted living in [place] there   Loves reading      Had an advanced directive   Requested DNR and order done   Would accept hospitalization   Would not want feeding tube   Requests son Jonny Ruiz (who lives locally) to be health care POA   Family History   Problem Relation Age of Onset  . Diabetes Neg Hx   . Hypertension Neg Hx   . Breast cancer Neg Hx   . Colon cancer Neg Hx    Allergies  Allergen Reactions  . Codeine Other (See Comments)    Reaction:  Unknown   . Sulfa Antibiotics Other (See Comments)    Reaction:  Unknown    Prior to Admission medications   Medication Sig Start Date End Date Taking? Authorizing Provider  acetaminophen (TYLENOL ARTHRITIS PAIN) 650 MG CR tablet Take 650 mg by mouth 2 (two) times daily.     Historical Provider, MD  acetaminophen (TYLENOL) 325 MG tablet Take 650 mg by mouth 3 (three) times daily as needed for mild pain.    Historical Provider, MD  albuterol (PROVENTIL HFA;VENTOLIN HFA) 108 (90 BASE) MCG/ACT inhaler Inhale 2 puffs into the lungs every 4 (four) hours as needed for wheezing or shortness of breath.    Historical Provider, MD  alum & mag hydroxide-simeth (MAALOX/MYLANTA) 200-200-20 MG/5ML suspension Take 30 mLs by mouth every 4 (four) hours as needed for indigestion, heartburn or flatulence.    Historical Provider, MD  aspirin 81 MG chewable tablet Chew 81 mg by mouth daily.    Historical Provider, MD  bisacodyl (DULCOLAX) 10 MG suppository  Place 1 suppository (10 mg total) rectally daily as needed for moderate constipation. 01/17/15   Adrian SaranSital Mody, MD  carbidopa-levodopa (SINEMET) 25-100 MG per tablet Take 1 tablet by mouth 4 (four) times daily.      Historical Provider, MD  enoxaparin (LOVENOX) 30 MG/0.3ML injection 30 mg [place] daily for 10 days then STOP 01/17/15   Sital Mody, MD  furosemide (LASIX) 20 MG tablet Take 20 mg by mouth daily.     Historical Provider, MD  HYDROcodone-acetaminophen (NORCO/VICODIN) 5-325 MG per tablet Take 1 tablet by mouth every 4 (four) hours as needed (pain). Patient taking differently: Take 1 tablet by mouth every 4 (four) hours as needed for moderate pain.  06/23/13   Karie Schwalbeichard I Letvak, MD  loratadine (CLARITIN) 10 MG tablet Take 10 mg by mouth daily.    Historical  Provider, MD  Multiple Vitamin (MULTIVITAMIN WITH MINERALS) TABS tablet Take 1 tablet by mouth at bedtime.    Historical Provider, MD  Multiple Vitamins-Minerals (PRESERVISION AREDS 2 PO) Take 1 tablet by mouth daily at 12 noon.    Historical Provider, MD  oxyCODONE (OXY IR/ROXICODONE) 5 MG immediate release tablet Take 1-2 tablets (5-10 mg total) by mouth every 4 (four) hours as needed for breakthrough pain ((for MODERATE breakthrough pain)). 01/17/15   Adrian SaranSital Mody, MD  polyethylene glycol (MIRALAX / GLYCOLAX) packet Take 17 g by mouth daily as needed for mild constipation. 01/17/15   Adrian SaranSital Mody, MD  pramipexole (MIRAPEX) 0.25 MG tablet Take 0.25 mg by mouth 3 (three) times daily.      Historical Provider, MD  sertraline (ZOLOFT) 25 MG tablet Take 25 mg by mouth at bedtime.     Historical Provider, MD  Vitamin D, Ergocalciferol, (DRISDOL) 50000 UNITS CAPS capsule Take 50,000 Units by mouth every 30 (thirty) days. Pt takes on the 24th of every month.    Historical Provider, MD     Positive ROS: All other systems have been reviewed and were otherwise negative with the exception of those mentioned in the HPI and as above.  Physical Exam: General: Alert, no acute distress Cardiovascular: Regular rate and rhythm, no murmurs rubs or gallops.  No pedal edema Respiratory: Clear to auscultation bilaterally, no wheezes rales or rhonchi. No cyanosis, no use of accessory musculature GI: No organomegaly, abdomen is soft and non-tender nondistended with positive bowel sounds. Skin: Skin intact, no lesions within the operative field. Neurologic: Sensation intact distally Psychiatric: Patient is competent for consent with normal mood and affect Lymphatic: No cervical lymphadenopathy  MUSCULOSKELETAL: Left lower extremity: Patient has swelling and ecchymosis of the left leg. There is an extension deformity of the left lower leg. Her compartments are soft and compressible. She can flex and extend her toes and  dorsiflex and plantarflex her ankle. She has palpable pedal pulses and intact sensation light touch.   Assessment: Left proximal tibia fracture, closed   Plan:  I have reviewed with the patient and her family the findings on her x-rays today. They understand that she has a displaced fracture of her tibia. Treatment for this injury is complicated by the patient's age and thin skin. I reviewed the treatment options could include splinting in place which would likely lead to a nonunion or malunion, closed reduction and casting which may lead to skin ulceration or breakdown and possible nonunion versus malunion, external fixation which may need to skin problems or malunion or nonunion, open reduction internal fixation which may lead to wound healing problems or possible intramedullary fixation  although the fracture is very proximal and this may be technically difficult to perform. I'm going to discuss this case with Dr. Myrene Galas, the traumatologist at [facility] to determine the best treatment for this patient. I will update the patient and her family in the morning. In the meantime I'm going to keep her nothing by mouth until a decision regarding surgery is made. Patient will be cleared by the hospitalist service for surgery in the meantime. Patient is in place in a posterior splint to stabilize the tibia. She remains neurovascularly intact by my repeat exam at of 6:15 PM. Patient is nothing by mouth after midnight.   Juanell Fairly, MD   10/16/2015 6:20 PM

## 2015-10-16 NOTE — Consult Note (Signed)
[employer] - [place] [facility]   PATIENT NAME: Rachel Faulkner    MR#:  [id number]  DATE OF BIRTH:  1914/11/12  DATE OF CONSULT:  10/16/2015  PRIMARY CARE PHYSICIAN: Tillman Abide, MD   REQUESTING/REFERRING PHYSICIAN: Dr. Juanell Fairly  CHIEF COMPLAINT:  No chief complaint on file. Preoperative medical evaluation   HISTORY OF PRESENT ILLNESS:  Rachel Faulkner  is a 82 y.o. female with a known history of Dementia without behavioral disturbance, anxiety, osteoarthritis, depression, osteoporosis, Parkinson's disease who was admitted to the hospital from the orthopedics office due to a left tibia and fibular fracture. Patient apparently resides at [facility] and fell at the facility a few days back. She was complaining of significant left leg pain and therefore her son took her to see orthopedics as an outpatient. Patient was noted to have a left tibia and fibular fracture and therefore admitted to the hospital for possible surgical intervention. Hospitalist services were contacted for preoperative clearance and medical evaluation. Patient presently denies any chest pain, shortness of breath, nausea vomiting abdominal pain or any other associated symptoms.   PAST MEDICAL HISTORY:   Past Medical History:  Diagnosis Date  . Anxiety   . Arthritis   . Dementia without behavioral disturbance   . Depression   . Osteoporosis   . Parkinson disease (HCC)   . Urinary incontinence     PAST SURGICAL HISTOIRY:   Past Surgical History:  Procedure Laterality Date  . APPENDECTOMY    . COLONOSCOPY W/ POLYPECTOMY  ?1980's  . FEMUR IM NAIL Left 01/15/2015   Procedure: INTRAMEDULLARY (IM) NAIL FEMORAL;  Surgeon: Juanell Fairly, MD;  Location: [facility] ORS;  Service: Orthopedics;  Laterality: Left;  femur  . TONSILLECTOMY      SOCIAL HISTORY:   Social History  Substance Use Topics  . Smoking status: Former Smoker    Years: 2.00  . Smokeless  tobacco: Former Neurosurgeon  . Alcohol use No     Comment: did drink a little in distant past    FAMILY HISTORY:   Family History  Problem Relation Age of Onset  . Diabetes Neg Hx   . Hypertension Neg Hx   . Breast cancer Neg Hx   . Colon cancer Neg Hx     DRUG ALLERGIES:   Allergies  Allergen Reactions  . Codeine Other (See Comments)    Reaction:  Unknown   . Sulfa Antibiotics Other (See Comments)    Reaction:  Unknown     REVIEW OF SYSTEMS:   Review of Systems  Unable to perform ROS: Dementia     MEDICATIONS AT HOME:   Prior to Admission medications   Medication Sig Start Date End Date Taking? Authorizing Provider  acetaminophen (TYLENOL ARTHRITIS PAIN) 650 MG CR tablet Take 650 mg by mouth 2 (two) times daily.     Historical Provider, MD  acetaminophen (TYLENOL) 325 MG tablet Take 650 mg by mouth 3 (three) times daily as needed for mild pain.    Historical Provider, MD  albuterol (PROVENTIL HFA;VENTOLIN HFA) 108 (90 BASE) MCG/ACT inhaler Inhale 2 puffs into the lungs every 4 (four) hours as needed for wheezing or shortness of breath.    Historical Provider, MD  alum & mag hydroxide-simeth (MAALOX/MYLANTA) 200-200-20 MG/5ML suspension Take 30 mLs by mouth every 4 (four) hours as needed for indigestion, heartburn or flatulence.    Historical Provider, MD  aspirin 81 MG chewable tablet Chew 81 mg by mouth  daily.    Historical Provider, MD  bisacodyl (DULCOLAX) 10 MG suppository Place 1 suppository (10 mg total) rectally daily as needed for moderate constipation. 01/17/15   Adrian SaranSital Mody, MD  carbidopa-levodopa (SINEMET) 25-100 MG per tablet Take 1 tablet by mouth 4 (four) times daily.      Historical Provider, MD  enoxaparin (LOVENOX) 30 MG/0.3ML injection 30 mg [place] daily for 10 days then STOP 01/17/15   Sital Mody, MD  furosemide (LASIX) 20 MG tablet Take 20 mg by mouth daily.     Historical Provider, MD  HYDROcodone-acetaminophen (NORCO/VICODIN) 5-325 MG per tablet Take 1 tablet  by mouth every 4 (four) hours as needed (pain). Patient taking differently: Take 1 tablet by mouth every 4 (four) hours as needed for moderate pain.  06/23/13   Karie Schwalbeichard I Letvak, MD  loratadine (CLARITIN) 10 MG tablet Take 10 mg by mouth daily.    Historical Provider, MD  Multiple Vitamin (MULTIVITAMIN WITH MINERALS) TABS tablet Take 1 tablet by mouth at bedtime.    Historical Provider, MD  Multiple Vitamins-Minerals (PRESERVISION AREDS 2 PO) Take 1 tablet by mouth daily at 12 noon.    Historical Provider, MD  oxyCODONE (OXY IR/ROXICODONE) 5 MG immediate release tablet Take 1-2 tablets (5-10 mg total) by mouth every 4 (four) hours as needed for breakthrough pain ((for MODERATE breakthrough pain)). 01/17/15   Adrian SaranSital Mody, MD  polyethylene glycol (MIRALAX / GLYCOLAX) packet Take 17 g by mouth daily as needed for mild constipation. 01/17/15   Adrian SaranSital Mody, MD  pramipexole (MIRAPEX) 0.25 MG tablet Take 0.25 mg by mouth 3 (three) times daily.      Historical Provider, MD  sertraline (ZOLOFT) 25 MG tablet Take 25 mg by mouth at bedtime.     Historical Provider, MD  Vitamin D, Ergocalciferol, (DRISDOL) 50000 UNITS CAPS capsule Take 50,000 Units by mouth every 30 (thirty) days. Pt takes on the 24th of every month.    Historical Provider, MD      VITAL SIGNS:  Blood pressure (!) 159/57, pulse 71, temperature 99 F (37.2 C), temperature source Oral, resp. rate 18, height 5' (1.524 m), weight 54.4 kg (120 lb), SpO2 95 %.  PHYSICAL EXAMINATION:  GENERAL:  82 y.o.-year-old patient lying in the bed in no acute distress.  EYES: Pupils equal, round, reactive to light and accommodation. No scleral icterus. Extraocular muscles intact.  HEENT: Head atraumatic, normocephalic. Oropharynx and nasopharynx clear.  NECK:  Supple, no jugular venous distention. No thyroid enlargement, no tenderness.  LUNGS: Normal breath sounds bilaterally, no wheezing, rales, rhonchi . No use of accessory muscles of respiration.   CARDIOVASCULAR: S1, S2, RRR.  II/VI SEM at base , No rubs, gallops, clicks.  ABDOMEN: Soft, nontender, nondistended. Bowel sounds present. No organomegaly or mass.  EXTREMITIES: No pedal edema, cyanosis, or clubbing. Left lower extremity ecchymosis and bruising noted and also left leg is in a soft cast. NEUROLOGIC: Cranial nerves II through XII are intact. No focal motor or sensory deficits appreciated bilaterally  PSYCHIATRIC: The patient is alert and oriented x 1.  SKIN: No obvious rash, lesion, or ulcer.   LABORATORY PANEL:   CBC  Recent Labs Lab 10/16/15 1413  WBC 10.0  HGB 11.6*  HCT 33.8*  PLT 310   ------------------------------------------------------------------------------------------------------------------  Chemistries   Recent Labs Lab 10/16/15 1413  NA 138  K 4.2  CL 100*  CO2 30  GLUCOSE 102*  BUN 29*  CREATININE 0.65  CALCIUM 9.0  AST 29  ALT 10*  ALKPHOS 114  BILITOT 0.7   ------------------------------------------------------------------------------------------------------------------  Cardiac Enzymes No results for input(s): TROPONINI in the last 168 hours. ------------------------------------------------------------------------------------------------------------------  RADIOLOGY:  No results found.   IMPRESSION AND PLAN:   82 year old female with past medical history of dementia without mental disturbance, Parkinson's disease, depression/anxiety presented to the hospital after a fall a few days ago noted to have a left tibia and fibular fracture.  1. Preoperative cardiovascular examination-patient is a low to moderate risk for noncardiac surgery. -No absolute contraindications surgery at this time. -EKG has been reviewed and shows no acute ST or T-wave changes.  2. Left tibia and fibula fracture-continue further care as per orthopedics. -Continue pain control as per orthopedics.  3. Parkinson's disease-continue Sinemet.  4.  Anxiety/depression-continue Zoloft.  Thank you so much for the consultation will follow along with you.  All the records are reviewed and case discussed with Consulting provider. Management plans discussed with the patient, family and they are in agreement.  CODE STATUS: Full   TOTAL TIME TAKING CARE OF THIS PATIENT: 45  minutes.    Houston Siren M.D on 10/16/2015 at 8:33 PM  Between 7am to 6pm - Pager - [phone]  After 6pm go to [email or web address] - password [email or web address] [facility]  [place] [facility]  Office  [phone]  CC: Primary care Physician: Tillman Abide, MD

## 2015-10-16 NOTE — Progress Notes (Signed)
Nurse attempted to place foley x 2 with Natasha MeadJeri, CNA and Audrey,RN present with no success.  Patient medicated for pain prior to attempt but patient would not cooperate with staff to place foley.  Patient hitting and cussing at staff. MD notified with no further order obtained at this time.

## 2015-10-17 ENCOUNTER — Inpatient Hospital Stay (HOSPITAL_COMMUNITY): Payer: Medicare (Managed Care)

## 2015-10-17 ENCOUNTER — Inpatient Hospital Stay (HOSPITAL_COMMUNITY)
Admission: AD | Admit: 2015-10-17 | Discharge: 2015-10-20 | DRG: 494 | Disposition: A | Discharge status: Skilled Nursing Facility | Payer: Medicare (Managed Care) | Source: Other Acute Inpatient Hospital | Attending: Orthopedic Surgery | Admitting: Orthopedic Surgery

## 2015-10-17 DIAGNOSIS — M24562 Contracture, left knee: Secondary | ICD-10-CM | POA: Diagnosis present

## 2015-10-17 DIAGNOSIS — Y998 Other external cause status: Secondary | ICD-10-CM

## 2015-10-17 DIAGNOSIS — Z79891 Long term (current) use of opiate analgesic: Secondary | ICD-10-CM

## 2015-10-17 DIAGNOSIS — R269 Unspecified abnormalities of gait and mobility: Secondary | ICD-10-CM | POA: Diagnosis present

## 2015-10-17 DIAGNOSIS — Z79899 Other long term (current) drug therapy: Secondary | ICD-10-CM

## 2015-10-17 DIAGNOSIS — Y9389 Activity, other specified: Secondary | ICD-10-CM

## 2015-10-17 DIAGNOSIS — W1789XA Other fall from one level to another, initial encounter: Secondary | ICD-10-CM | POA: Diagnosis present

## 2015-10-17 DIAGNOSIS — S82102A Unspecified fracture of upper end of left tibia, initial encounter for closed fracture: Secondary | ICD-10-CM

## 2015-10-17 DIAGNOSIS — M810 Age-related osteoporosis without current pathological fracture: Secondary | ICD-10-CM | POA: Diagnosis present

## 2015-10-17 DIAGNOSIS — Z882 Allergy status to sulfonamides status: Secondary | ICD-10-CM | POA: Diagnosis not present

## 2015-10-17 DIAGNOSIS — Z66 Do not resuscitate: Secondary | ICD-10-CM | POA: Diagnosis present

## 2015-10-17 DIAGNOSIS — Z7982 Long term (current) use of aspirin: Secondary | ICD-10-CM

## 2015-10-17 DIAGNOSIS — G20 Parkinson's disease: Secondary | ICD-10-CM | POA: Diagnosis present

## 2015-10-17 DIAGNOSIS — Z8781 Personal history of (healed) traumatic fracture: Secondary | ICD-10-CM

## 2015-10-17 DIAGNOSIS — R1310 Dysphagia, unspecified: Secondary | ICD-10-CM | POA: Diagnosis present

## 2015-10-17 DIAGNOSIS — Z87891 Personal history of nicotine dependence: Secondary | ICD-10-CM | POA: Diagnosis not present

## 2015-10-17 DIAGNOSIS — Z885 Allergy status to narcotic agent status: Secondary | ICD-10-CM | POA: Diagnosis not present

## 2015-10-17 DIAGNOSIS — R32 Unspecified urinary incontinence: Secondary | ICD-10-CM | POA: Diagnosis present

## 2015-10-17 DIAGNOSIS — F419 Anxiety disorder, unspecified: Secondary | ICD-10-CM | POA: Diagnosis present

## 2015-10-17 DIAGNOSIS — Z9181 History of falling: Secondary | ICD-10-CM

## 2015-10-17 DIAGNOSIS — F329 Major depressive disorder, single episode, unspecified: Secondary | ICD-10-CM | POA: Diagnosis present

## 2015-10-17 DIAGNOSIS — Z419 Encounter for procedure for purposes other than remedying health state, unspecified: Secondary | ICD-10-CM

## 2015-10-17 DIAGNOSIS — F0280 Dementia in other diseases classified elsewhere without behavioral disturbance: Secondary | ICD-10-CM | POA: Diagnosis present

## 2015-10-17 DIAGNOSIS — Y92129 Unspecified place in nursing home as the place of occurrence of the external cause: Secondary | ICD-10-CM

## 2015-10-17 DIAGNOSIS — I10 Essential (primary) hypertension: Secondary | ICD-10-CM | POA: Diagnosis present

## 2015-10-17 DIAGNOSIS — F32A Depression, unspecified: Secondary | ICD-10-CM | POA: Diagnosis present

## 2015-10-17 DIAGNOSIS — F0390 Unspecified dementia without behavioral disturbance: Secondary | ICD-10-CM | POA: Diagnosis present

## 2015-10-17 DIAGNOSIS — Z01818 Encounter for other preprocedural examination: Secondary | ICD-10-CM

## 2015-10-17 DIAGNOSIS — S82209A Unspecified fracture of shaft of unspecified tibia, initial encounter for closed fracture: Secondary | ICD-10-CM

## 2015-10-17 HISTORY — DX: Unspecified fracture of upper end of left tibia, initial encounter for closed fracture: S82.102A

## 2015-10-17 LAB — CBC
HEMATOCRIT: 34.2 % — AB (ref 35.0–47.0)
HEMATOCRIT: 32.8 % — AB (ref 36.0–46.0)
HEMOGLOBIN: 11.4 g/dL — AB (ref 12.0–16.0)
Hemoglobin: 10.3 g/dL — ABNORMAL LOW (ref 12.0–15.0)
MCH: 30.7 pg (ref 26.0–34.0)
MCH: 30.2 pg (ref 26.0–34.0)
MCHC: 33.3 g/dL (ref 32.0–36.0)
MCHC: 31.4 g/dL (ref 30.0–36.0)
MCV: 92.1 fL (ref 80.0–100.0)
MCV: 96.2 fL (ref 78.0–100.0)
PLATELETS: 309 10*3/uL (ref 150–440)
PLATELETS: 357 10*3/uL (ref 150–400)
RBC: 3.71 MIL/uL — AB (ref 3.80–5.20)
RBC: 3.41 MIL/uL — ABNORMAL LOW (ref 3.87–5.11)
RDW: 12.7 % (ref 11.5–14.5)
RDW: 12.8 % (ref 11.5–15.5)
WBC: 9.4 10*3/uL (ref 3.6–11.0)
WBC: 8.5 10*3/uL (ref 4.0–10.5)

## 2015-10-17 LAB — BASIC METABOLIC PANEL
Anion gap: 9 (ref 5–15)
BUN: 23 mg/dL — ABNORMAL HIGH (ref 6–20)
CHLORIDE: 107 mmol/L (ref 101–111)
CO2: 24 mmol/L (ref 22–32)
CREATININE: 0.49 mg/dL (ref 0.44–1.00)
Calcium: 8.7 mg/dL — ABNORMAL LOW (ref 8.9–10.3)
GFR calc non Af Amer: >60 mL/min (ref >60)
Glucose, Bld: 116 mg/dL — ABNORMAL HIGH (ref 65–99)
POTASSIUM: 4 mmol/L (ref 3.5–5.1)
Sodium: 140 mmol/L (ref 135–145)

## 2015-10-17 LAB — CREATININE, SERUM: CREATININE: 0.54 mg/dL (ref 0.44–1.00)

## 2015-10-17 LAB — GLUCOSE, CAPILLARY: Glucose-Capillary: 109 mg/dL — ABNORMAL HIGH (ref 65–99)

## 2015-10-17 MED ORDER — HYDROCODONE-ACETAMINOPHEN 5-325 MG PO TABS: 1.0000 | ORAL_TABLET | ORAL | Status: DC | PRN

## 2015-10-17 MED ORDER — ALBUTEROL SULFATE (2.5 MG/3ML) 0.083% IN NEBU: 2.5000 mg | INHALATION_SOLUTION | RESPIRATORY_TRACT | Status: DC | PRN

## 2015-10-17 MED ORDER — FUROSEMIDE 20 MG PO TABS
20.0000 mg | ORAL_TABLET | Freq: Every day | ORAL | Status: DC
  Administered 2015-10-19 – 2015-10-20 (×2): 20 mg via ORAL
  Filled 2015-10-17 (×3): qty 1

## 2015-10-17 MED ORDER — ADULT MULTIVITAMIN W/MINERALS CH
1.0000 | ORAL_TABLET | Freq: Every day | ORAL | Status: DC
  Administered 2015-10-17 – 2015-10-19 (×3): 1 via ORAL
  Filled 2015-10-17 (×3): qty 1

## 2015-10-17 MED ORDER — ENOXAPARIN SODIUM 30 MG/0.3ML ~~LOC~~ SOLN
30.0000 mg | SUBCUTANEOUS | Status: DC
  Administered 2015-10-19 – 2015-10-20 (×2): 30 mg via SUBCUTANEOUS
  Filled 2015-10-17 (×2): qty 0.3

## 2015-10-17 MED ORDER — CARBIDOPA-LEVODOPA 25-100 MG PO TABS
1.0000 | ORAL_TABLET | Freq: Four times a day (QID) | ORAL | Status: DC
  Administered 2015-10-17 – 2015-10-20 (×8): 1 via ORAL
  Filled 2015-10-17 (×8): qty 1

## 2015-10-17 MED ORDER — LACTATED RINGERS IV SOLN
INTRAVENOUS | Status: DC
  Administered 2015-10-17: 21:00:00 via INTRAVENOUS

## 2015-10-17 MED ORDER — ALUM & MAG HYDROXIDE-SIMETH 200-200-20 MG/5ML PO SUSP
30.0000 mL | ORAL | Status: DC | PRN
Start: 2015-10-17 — End: 2015-10-20

## 2015-10-17 MED ORDER — SERTRALINE HCL 25 MG PO TABS
25.0000 mg | ORAL_TABLET | Freq: Every day | ORAL | Status: DC
  Administered 2015-10-17 – 2015-10-19 (×3): 25 mg via ORAL
  Filled 2015-10-17 (×3): qty 1

## 2015-10-17 MED ORDER — BISACODYL 10 MG RE SUPP: 10.0000 mg | Freq: Every day | RECTAL | Status: DC | PRN

## 2015-10-17 MED ORDER — ACETAMINOPHEN 650 MG RE SUPP: 650.0000 mg | Freq: Four times a day (QID) | RECTAL | Status: DC | PRN

## 2015-10-17 MED ORDER — PRAMIPEXOLE DIHYDROCHLORIDE 0.125 MG PO TABS
0.2500 mg | ORAL_TABLET | Freq: Three times a day (TID) | ORAL | Status: DC
  Administered 2015-10-17 – 2015-10-20 (×7): 0.25 mg via ORAL
  Filled 2015-10-17 (×7): qty 2

## 2015-10-17 MED ORDER — ACETAMINOPHEN 325 MG PO TABS
650.0000 mg | ORAL_TABLET | Freq: Four times a day (QID) | ORAL | Status: DC | PRN
  Administered 2015-10-17 – 2015-10-19 (×3): 650 mg via ORAL
  Filled 2015-10-17 (×3): qty 2

## 2015-10-17 NOTE — Clinical Social Work Note (Signed)
Clinical Social Work Assessment  Patient Details  Name: Rachel Faulkner MRN: [id number] Date of Birth: 05/03/1914  Date of referral:  10/17/15               Reason for consult:  Other (Comment Required) (From [facility] )                Permission sought to share information with:  Chartered certified accountant granted to share information::  Yes, Verbal Permission Granted  Name::      Retail buyer::   [place]   Relationship::     Contact Information:     Housing/Transportation Living arrangements for the past 2 months:  [place] of Information:  [place], Adult Children Patient Interpreter Needed:  None Criminal Activity/Legal Involvement Pertinent to Current Situation/Hospitalization:  No - Comment as needed Significant Relationships:  Adult Children Lives with:  Facility Resident Do you feel safe going back to the place where you live?  Yes Need for family participation in patient care:  Yes (Comment)  Care giving concerns:  Patient is a long term care resident at [facility].    Social Worker assessment / plan:  Holiday representative ([place]) reviewed chart and noted that patient is from [place]. Per Seth Bake admissions coordinator at [facility] patient is a long term care resident at the SNF and can return when stable. Patient was a direct admit to [facility] from Dr. Harden Mo ortho office. Per RN MD is coming up today to speak with patient and family about treatment options. CSW met with patient and her son/ HPOA Rachel Faulkner at bedside. CSW introduced self and explained role of CSW department. Per son he is HPOA and patient has 2 other adult sons, one lives in [place] and the other one lives in [place]. Per Jenny Reichmann patient has been at [facility] for 10 years and the plan is to return there. FL2 complete and faxed out. CSW will continue to follow and assist as needed.   Employment status:  Retired Radiation protection practitioner:  Managed Care PT Recommendations:  Not assessed at this time Information / Referral to community resources:  [place]  Patient/Family's Response to care:  Son is waiting on MD to discuss treatment options.   Patient/Family's Understanding of and Emotional Response to Diagnosis, Current Treatment, and Prognosis:  Son was pleasant and thanked CSW for visit.   Emotional Assessment Appearance:  Appears stated age Attitude/Demeanor/Rapport:  Unable to Assess Affect (typically observed):  Unable to Assess Orientation:  Oriented to Self, Fluctuating Orientation (Suspected and/or reported Sundowners) Alcohol / Substance use:  Not Applicable Psych involvement (Current and /or in the community):  No (Comment)  Discharge Needs  Concerns to be addressed:  Discharge Planning Concerns Readmission within the last 30 days:  No Current discharge risk:  Chronically ill, Cognitively Impaired, Dependent with Mobility Barriers to Discharge:  Continued Medical Work up   [employer], Veronia Beets, LCSW 10/17/2015, 11:16 AM

## 2015-10-17 NOTE — Progress Notes (Signed)
Subjective:  I have reevaluated the patient again this morning. Her son is at the bedside. Patient is confused but appears comfortable. She can follow simple commands. Patient reports left leg pain as mild.    Objective:   VITALS:   Vitals:   10/17/15 0337 10/17/15 0803 10/17/15 0805 10/17/15 0812  BP: (!) 164/68 (!) 197/161 (!) 124/108 132/68  Pulse: 73 (!) 57 70   Resp: 19     Temp: 98.7 F (37.1 C) 99.2 F (37.3 C)    TempSrc: Oral Oral    SpO2: 96% 93%    Weight:      Height:        PHYSICAL EXAM:  Left lower extremity: Patient is lying in bed with the left lower extremity elevated. She is in no acute distress. She can flex and extend her toes. Her toes well-perfused. She has intact sensation light touch throughout the left lower extremity. Her leg compartments are soft and compressible. She continues to have knee and lower extremity swelling and ecchymosis.   LABS  Results for orders placed or performed during the hospital encounter of 10/16/15 (from the past 24 hour(s))  APTT     Status: None   Collection Time: 10/16/15  2:13 PM  Result Value Ref Range   aPTT 24 24 - 36 seconds  CBC WITH DIFFERENTIAL     Status: Abnormal   Collection Time: 10/16/15  2:13 PM  Result Value Ref Range   WBC 10.0 3.6 - 11.0 K/uL   RBC 3.70 (L) 3.80 - 5.20 MIL/uL   Hemoglobin 11.6 (L) 12.0 - 16.0 g/dL   HCT 16.1 (L) 09.6 - 04.5 %   MCV 91.4 80.0 - 100.0 fL   MCH 31.4 26.0 - 34.0 pg   MCHC 34.4 32.0 - 36.0 g/dL   RDW 40.9 81.1 - 91.4 %   Platelets 310 150 - 440 K/uL   Neutrophils Relative % 80 %   Neutro Abs 8.0 (H) 1.4 - 6.5 K/uL   Lymphocytes Relative 12 %   Lymphs Abs 1.2 1.0 - 3.6 K/uL   Monocytes Relative 6 %   Monocytes Absolute 0.6 0.2 - 0.9 K/uL   Eosinophils Relative 1 %   Eosinophils Absolute 0.1 0 - 0.7 K/uL   Basophils Relative 1 %   Basophils Absolute 0.0 0 - 0.1 K/uL  Comprehensive metabolic panel     Status: Abnormal   Collection Time: 10/16/15  2:13 PM  Result  Value Ref Range   Sodium 138 135 - 145 mmol/L   Potassium 4.2 3.5 - 5.1 mmol/L   Chloride 100 (L) 101 - 111 mmol/L   CO2 30 22 - 32 mmol/L   Glucose, Bld 102 (H) 65 - 99 mg/dL   BUN 29 (H) 6 - 20 mg/dL   Creatinine, Ser 7.82 0.44 - 1.00 mg/dL   Calcium 9.0 8.9 - 95.6 mg/dL   Total Protein 7.6 6.5 - 8.1 g/dL   Albumin 3.4 (L) 3.5 - 5.0 g/dL   AST 29 15 - 41 U/L   ALT 10 (L) 14 - 54 U/L   Alkaline Phosphatase 114 38 - 126 U/L   Total Bilirubin 0.7 0.3 - 1.2 mg/dL   GFR calc non Af Amer >60 >60 mL/min   GFR calc Af Amer >60 >60 mL/min   Anion gap 8 5 - 15  Protime-INR     Status: None   Collection Time: 10/16/15  2:13 PM  Result Value Ref Range   Prothrombin Time  13.1 11.4 - 15.2 seconds   INR 0.99   Type and screen If not already done in ED     Status: None   Collection Time: 10/16/15  2:13 PM  Result Value Ref Range   ABO/RH(D) A NEG    Antibody Screen NEG    Sample Expiration 10/19/2015   MRSA PCR Screening     Status: None   Collection Time: 10/16/15  3:34 PM  Result Value Ref Range   MRSA by PCR NEGATIVE NEGATIVE  Basic metabolic panel     Status: Abnormal   Collection Time: 10/17/15  4:49 AM  Result Value Ref Range   Sodium 140 135 - 145 mmol/L   Potassium 4.0 3.5 - 5.1 mmol/L   Chloride 107 101 - 111 mmol/L   CO2 24 22 - 32 mmol/L   Glucose, Bld 116 (H) 65 - 99 mg/dL   BUN 23 (H) 6 - 20 mg/dL   Creatinine, Ser 1.190.49 0.44 - 1.00 mg/dL   Calcium 8.7 (L) 8.9 - 10.3 mg/dL   GFR calc non Af Amer >60 >60 mL/min   GFR calc Af Amer >60 >60 mL/min   Anion gap 9 5 - 15  CBC     Status: Abnormal   Collection Time: 10/17/15  4:49 AM  Result Value Ref Range   WBC 9.4 3.6 - 11.0 K/uL   RBC 3.71 (L) 3.80 - 5.20 MIL/uL   Hemoglobin 11.4 (L) 12.0 - 16.0 g/dL   HCT 14.734.2 (L) 82.935.0 - 56.247.0 %   MCV 92.1 80.0 - 100.0 fL   MCH 30.7 26.0 - 34.0 pg   MCHC 33.3 32.0 - 36.0 g/dL   RDW 13.012.7 86.511.5 - 78.414.5 %   Platelets 309 150 - 440 K/uL    No results found.  Assessment/Plan:      Active Problems:   Fx upper tibia/fibula-closed  I have spoken with the [facility]opedic traumatologist at [facility], Dr. Myrene GalasMichael Handy. Given the complexity of this case based on patient's age and the proximal location of the fracture, I have [facility]. Dr. Carola FrostHandy has agreed to accept the patient. The plan will be to take the patient to the operating room Thursday or Friday for intramedullary fixation. Patient is posteriorly splinted. She remains neurovascularly intact and is comfortable. I explained the plan to the patient's son, given the patient's confusion, and he has agreed with the plan for transfer.  Patient has had preop labs performed. [facility] and has been cleared for surgery by a hospitalist.    Juanell FairlyKRASINSKI, Shene Maxfield , MD 10/17/2015, 12:04 PM

## 2015-10-17 NOTE — Care Management (Signed)
Long term resident from [place]. It is anticipated that patient will return there at discharge. CSW following

## 2015-10-17 NOTE — Progress Notes (Signed)
Dr Martha ClanKrasinski called, states patient will not have surgery today. Patient and family updated.

## 2015-10-17 NOTE — Discharge Summary (Signed)
[employer] [facility] [place] at [facility]   PATIENT NAME: Rachel Faulkner    MR#:  [id number]  DATE OF BIRTH:  13-Mar-1914  DATE OF ADMISSION:  10/16/2015 ADMITTING PHYSICIAN: Juanell Fairly, MD  DATE OF DISCHARGE: 10/17/15  PRIMARY CARE PHYSICIAN: Tillman Abide, MD    ADMISSION DIAGNOSIS:  Lt tibia fx  DISCHARGE DIAGNOSIS:  Active Problems:   Fx upper tibia/fibula-closed   SECONDARY DIAGNOSIS:   Past Medical History:  Diagnosis Date  . Anxiety   . Arthritis   . Dementia without behavioral disturbance   . Depression   . Osteoporosis   . Parkinson disease (HCC)   . Urinary incontinence     HOSPITAL COURSE:  [name]  is a 82 y.o. female admitted 10/16/2015 with chief complaint fall/leg pain. Please see H&P performed by Juanell Fairly, MD for further information. Patient presented after fall found to have left upper tibia/fibula closed fracture. Patient was seen and evaluated by the hospitalist service for preoperative management. No further testing/interventions required prior to surgery - however given difficulty of surgery case was discussed with  Dr. Myrene Galas [facility]) who agreed to accept the patient  DISCHARGE CONDITIONS:   stable  CONSULTS OBTAINED:  Treatment Team:  Houston Siren, MD  DRUG ALLERGIES:   Allergies  Allergen Reactions  . Codeine Other (See Comments)    Reaction:  Unknown   . Sulfa Antibiotics Other (See Comments)    Reaction:  Unknown     DISCHARGE MEDICATIONS:   Current Discharge Medication List    CONTINUE these medications which have NOT CHANGED   Details  acetaminophen (TYLENOL ARTHRITIS PAIN) 650 MG CR tablet Take 650 mg by mouth 2 (two) times daily.     acetaminophen (TYLENOL) 325 MG tablet Take 650 mg by mouth 3 (three) times daily as needed for mild pain.    albuterol (PROVENTIL HFA;VENTOLIN HFA) 108 (90 BASE) MCG/ACT inhaler Inhale 2 puffs into the lungs every 4 (four) hours as needed for wheezing or  shortness of breath.    alum & mag hydroxide-simeth (MAALOX/MYLANTA) 200-200-20 MG/5ML suspension Take 30 mLs by mouth every 4 (four) hours as needed for indigestion, heartburn or flatulence.    aspirin 81 MG chewable tablet Chew 81 mg by mouth daily.    bisacodyl (DULCOLAX) 10 MG suppository Place 1 suppository (10 mg total) rectally daily as needed for moderate constipation. Qty: 12 suppository, Refills: 0    carbidopa-levodopa (SINEMET) 25-100 MG per tablet Take 1 tablet by mouth 4 (four) times daily.      enoxaparin (LOVENOX) 30 MG/0.3ML injection 30 mg [place] daily for 10 days then STOP Qty: 30 mL, Refills: 0    furosemide (LASIX) 20 MG tablet Take 20 mg by mouth daily.     HYDROcodone-acetaminophen (NORCO/VICODIN) 5-325 MG per tablet Take 1 tablet by mouth every 4 (four) hours as needed (pain). Qty: 30 tablet    loratadine (CLARITIN) 10 MG tablet Take 10 mg by mouth daily.    Multiple Vitamin (MULTIVITAMIN WITH MINERALS) TABS tablet Take 1 tablet by mouth at bedtime.    Multiple Vitamins-Minerals (PRESERVISION AREDS 2 PO) Take 1 tablet by mouth daily at 12 noon.    oxyCODONE (OXY IR/ROXICODONE) 5 MG immediate release tablet Take 1-2 tablets (5-10 mg total) by mouth every 4 (four) hours as needed for breakthrough pain ((for MODERATE breakthrough pain)). Qty: 30 tablet, Refills: 0    polyethylene glycol (MIRALAX / GLYCOLAX) packet Take 17 g by mouth daily as needed  for mild constipation. Qty: 14 each, Refills: 0    pramipexole (MIRAPEX) 0.25 MG tablet Take 0.25 mg by mouth 3 (three) times daily.      sertraline (ZOLOFT) 25 MG tablet Take 25 mg by mouth at bedtime.     Vitamin D, Ergocalciferol, (DRISDOL) 50000 UNITS CAPS capsule Take 50,000 Units by mouth every 30 (thirty) days. Pt takes on the 24th of every month.         DISCHARGE INSTRUCTIONS:    DIET:  Regular diet  DISCHARGE CONDITION:  Stable  ACTIVITY:  Activity as tolerated  OXYGEN:  Home Oxygen: No.     Oxygen Delivery: room air  DISCHARGE LOCATION:  [facility]  If you experience worsening of your admission symptoms, develop shortness of breath, life threatening emergency, suicidal or homicidal thoughts you must seek medical attention immediately by calling 911 or calling your MD immediately  if symptoms less severe.  You Must read complete instructions/literature along with all the possible adverse reactions/side effects for all the Medicines you take and that have been prescribed to you. Take any new Medicines after you have completely understood and accpet all the possible adverse reactions/side effects.   Please note  You were cared for by a hospitalist during your hospital stay. If you have any questions about your discharge medications or the care you received while you were in the hospital after you are discharged, you can call the unit and asked to speak with the hospitalist on call if the hospitalist that took care of you is not available. Once you are discharged, your primary care physician will handle any further medical issues. Please note that NO REFILLS for any discharge medications will be authorized once you are discharged, as it is imperative that you return to your primary care physician (or establish a relationship with a primary care physician if you do not have one) for your aftercare needs so that they can reassess your need for medications and monitor your lab values.    On the day of Discharge:   VITAL SIGNS:  Blood pressure (!) 157/68, pulse 65, temperature 98.5 F (36.9 C), temperature source Oral, resp. rate 19, height 5' (1.524 m), weight 54.4 kg (120 lb), SpO2 94 %.  I/O:   Intake/Output Summary (Last 24 hours) at 10/17/15 1222 Last data filed at 10/17/15 0415  Gross per 24 hour  Intake            672.5 ml  Output                0 ml  Net            672.5 ml    PHYSICAL EXAMINATION:  GENERAL:  82 y.o.-year-old patient lying in the bed with no acute distress.   EYES: Pupils equal, round, reactive to light and accommodation. No scleral icterus. Extraocular muscles intact.  HEENT: Head atraumatic, normocephalic. Oropharynx and nasopharynx clear.  NECK:  Supple, no jugular venous distention. No thyroid enlargement, no tenderness.  LUNGS: Normal breath sounds bilaterally, no wheezing, rales,rhonchi or crepitation. No use of accessory muscles of respiration.  CARDIOVASCULAR: S1, S2 normal. No murmurs, rubs, or gallops.  ABDOMEN: Soft, non-tender, non-distended. Bowel sounds present. No organomegaly or mass.  EXTREMITIES: No pedal edema, cyanosis, or clubbing. LLE immobilized NEUROLOGIC: Cranial nerves II through XII are intact.  Sensation intact. Gait not checked.  PSYCHIATRIC: The patient is awake, pleasantly confused  SKIN: No obvious rash, lesion, or ulcer.   DATA REVIEW:   CBC  Recent Labs Lab 10/17/15 0449  WBC 9.4  HGB 11.4*  HCT 34.2*  PLT 309    Chemistries   Recent Labs Lab 10/16/15 1413 10/17/15 0449  NA 138 140  K 4.2 4.0  CL 100* 107  CO2 30 24  GLUCOSE 102* 116*  BUN 29* 23*  CREATININE 0.65 0.49  CALCIUM 9.0 8.7*  AST 29  --   ALT 10*  --   ALKPHOS 114  --   BILITOT 0.7  --     Cardiac Enzymes No results for input(s): TROPONINI in the last 168 hours.  Microbiology Results  Results for orders placed or performed during the hospital encounter of 10/16/15  MRSA PCR Screening     Status: None   Collection Time: 10/16/15  3:34 PM  Result Value Ref Range Status   MRSA by PCR NEGATIVE NEGATIVE Final    Comment:        The GeneXpert MRSA Assay (FDA approved for NASAL specimens only), is one component of a comprehensive MRSA colonization surveillance program. It is not intended to diagnose MRSA infection nor to guide or monitor treatment for MRSA infections.     RADIOLOGY:  No results found.   Management plans discussed with the patient, family and they are in agreement.  CODE STATUS:     Code  Status Orders        Start     Ordered   10/16/15 1342  Full code  Continuous     10/16/15 1341    Code Status History    Date Active Date Inactive Code Status Order ID Comments User Context   01/15/2015  1:20 PM 01/17/2015  4:29 PM DNR [id number]  Juanell Fairly, MD Inpatient   01/14/2015  9:04 PM 01/15/2015  1:20 PM DNR [id number]  Ramonita Lab, MD Inpatient      TOTAL TIME TAKING CARE OF THIS PATIENT: 33 minutes.    Hower,  Mardi Mainland.D on 10/17/2015 at 12:22 PM  Between 7am to 6pm - Pager - [phone]  After 6pm go to [email or web address] - Social research officer, government  [employer] [facility]  Office  [phone]  CC: Primary care physician; Tillman Abide, MD

## 2015-10-17 NOTE — [place] FL2 (Signed)
[place] MEDICAID FL2 LEVEL OF CARE SCREENING TOOL     IDENTIFICATION  Patient Name: Rachel Faulkner Birthdate: November 20, 1914 Sex: female Admission Date (Current Location): 10/16/2015  [place] and [place] Number:  Chiropodist and Address:  [facility], [address], [place], [place] [address]      Provider Number: [id number]  Attending Physician Name and Address:  Juanell Fairly, MD  Relative Name and Phone Number:       Current Level of Care: Hospital Recommended Level of Care: Skilled Nursing Facility Prior Approval Number:    Date Approved/Denied:   PASRR Number:  ( [id number] )  Discharge Plan: SNF    Current Diagnoses: Patient Active Problem List   Diagnosis Date Noted  . Fx upper tibia/fibula-closed 10/16/2015  . Bilateral hip fractures (HCC) 01/14/2015  . DEMENTIA 10/26/2006  . ANXIETY 10/26/2006  . DEPRESSION 10/26/2006  . PARKINSON'S DISEASE 10/26/2006  . OSTEOARTHRITIS 10/26/2006  . OSTEOPOROSIS 10/26/2006  . URINARY INCONTINENCE 10/26/2006    Orientation RESPIRATION BLADDER Height & Weight     Self  Normal Incontinent Weight: 120 lb (54.4 kg) Height:  5' (152.4 cm)  BEHAVIORAL SYMPTOMS/MOOD NEUROLOGICAL BOWEL NUTRITION STATUS   (none)  (none) Incontinent Diet (Regular Diet )  AMBULATORY STATUS COMMUNICATION OF NEEDS Skin   Extensive Assist Verbally Normal                       Personal Care Assistance Level of Assistance  Bathing, Feeding, Dressing Bathing Assistance: Limited assistance Feeding assistance: Limited assistance Dressing Assistance: Limited assistance     Functional Limitations Info  Sight, Hearing, Speech Sight Info: Adequate Hearing Info: Adequate Speech Info: Adequate    SPECIAL CARE FACTORS FREQUENCY                       Contractures      Additional Factors Info  Code Status, Allergies, Psychotropic Code Status Info:  (Full Code. ) Allergies Info:  (Codeine, Sulfa  Antibiotics) Psychotropic Info:  (Zoloft )         Current Medications (10/17/2015):  This is the current hospital active medication list Current Facility-Administered Medications  Medication Dose Route Frequency Provider Last Rate Last Dose  . 0.9 %  sodium chloride infusion   Intravenous Continuous Juanell Fairly, MD 50 mL/hr at 10/16/15 1448    . carbidopa-levodopa (SINEMET IR) 25-100 MG per tablet immediate release 1 tablet  1 tablet Oral QID Houston Siren, MD   1 tablet at 10/17/15 1005  . docusate sodium (COLACE) capsule 100 mg  100 mg Oral BID Juanell Fairly, MD   100 mg at 10/17/15 1005  . magnesium citrate solution 1 Bottle  1 Bottle Oral Once PRN Juanell Fairly, MD      . magnesium hydroxide (MILK OF MAGNESIA) suspension 30 mL  30 mL Oral Daily PRN Juanell Fairly, MD      . MEDLINE mouth rinse  15 mL Mouth Rinse BID Juanell Fairly, MD   15 mL at 10/16/15 2129  . methocarbamol (ROBAXIN) tablet 500 mg  500 mg Oral Q6H PRN Juanell Fairly, MD       Or  . methocarbamol (ROBAXIN) 500 mg in dextrose 5 % 50 mL IVPB  500 mg Intravenous Q6H PRN Juanell Fairly, MD      . morphine 2 MG/ML injection 2 mg  2 mg Intravenous Q2H PRN Juanell Fairly, MD   2 mg at 10/17/15 7253  .  oxyCODONE (Oxy IR/ROXICODONE) immediate release tablet 5-10 mg  5-10 mg Oral Q4H PRN Juanell FairlyKevin Krasinski, MD   10 mg at 10/17/15 0509  . pramipexole (MIRAPEX) tablet 0.25 mg  0.25 mg Oral TID Houston SirenVivek J Sainani, MD   0.25 mg at 10/17/15 1005  . sertraline (ZOLOFT) tablet 25 mg  25 mg Oral QHS Houston SirenVivek J Sainani, MD   25 mg at 10/16/15 2134     Discharge Medications: Please see discharge summary for a list of discharge medications.  Relevant Imaging Results:  Relevant Lab Results:   Additional Information  (SSN: [id number])  Roberts Bon, Darleen CrockerBailey M, LCSW

## 2015-10-17 NOTE — Progress Notes (Signed)
[facility] accepting Dr. Myrene GalasMichael Handy

## 2015-10-17 NOTE — Progress Notes (Signed)
Orthopaedic Trauma Service   Dr. Martha ClanKrasinski requested assistance with management of Ms. Rachel Faulkner. Pt transferred [facility] [place] [facility]. [facility] around 1830.  Admission orders placed Chart reviewed Pts has been risk stratified by medicine at [facility] Full consult/h&p to follow  If schedule permits and soft tissues allow, plan on IMN tomorrow afternoon o/w may will likely do on Friday.   Please call/page me directly tonight if there are any questions  Mearl LatinKeith W. Ryaan Vanwagoner, PA-C [facility] [phone]) [phone] 10/17/2015 7:19 PM

## 2015-10-17 NOTE — Progress Notes (Signed)
[facility] [place] at [facility]   PATIENT NAME: Rachel Faulkner    MRN#:  [id number]  DATE OF BIRTH:  1914-05-11  SUBJECTIVE:  Hospital Day: 1 day Rachel Faulkner is a 82 y.o. female presenting with leg pain.   Overnight events: no overnight events Interval Events: no complaints this mornig  REVIEW OF SYSTEMS:  Unable to obtain given dementia  DRUG ALLERGIES:   Allergies  Allergen Reactions  . Codeine Other (See Comments)    Reaction:  Unknown   . Sulfa Antibiotics Other (See Comments)    Reaction:  Unknown     VITALS:  Blood pressure (!) 157/68, pulse 65, temperature 98.5 F (36.9 C), temperature source Oral, resp. rate 19, height 5' (1.524 m), weight 54.4 kg (120 lb), SpO2 94 %.  PHYSICAL EXAMINATION:  VITAL SIGNS: Vitals:   10/17/15 0812 10/17/15 1205  BP: 132/68 (!) 157/68  Pulse:  65  Resp:    Temp:  98.5 F (36.9 C)   GENERAL:82 y.o.female currently in no acute distress.  HEAD: Normocephalic, atraumatic.  EYES: Pupils equal, round, reactive to light. Extraocular muscles intact. No scleral icterus.  MOUTH: Moist mucosal membrane. Dentition intact. No abscess noted.  EAR, NOSE, THROAT: Clear without exudates. No external lesions.  NECK: Supple. No thyromegaly. No nodules. No JVD.  PULMONARY: Clear to ascultation, without wheeze rails or rhonci. No use of accessory muscles, Good respiratory effort. good air entry bilaterally CHEST: Nontender to palpation.  CARDIOVASCULAR: S1 and S2. Regular rate and rhythm. No murmurs, rubs, or gallops. No edema. Pedal pulses 2+ bilaterally.  GASTROINTESTINAL: Soft, nontender, nondistended. No masses. Positive bowel sounds. No hepatosplenomegaly.  MUSCULOSKELETAL: No swelling, clubbing, or edema. Range of motion full in all extremities.  NEUROLOGIC: Cranial nerves II through XII are intact. No gross focal neurological deficits. Sensation intact. Reflexes intact.  SKIN: No ulceration, lesions, rashes,  or cyanosis. Skin warm and dry. Turgor intact.  PSYCHIATRIC: Mood, affect within normal limits. The patient is awake, alert      LABORATORY PANEL:   CBC  Recent Labs Lab 10/17/15 0449  WBC 9.4  HGB 11.4*  HCT 34.2*  PLT 309   ------------------------------------------------------------------------------------------------------------------  Chemistries   Recent Labs Lab 10/16/15 1413 10/17/15 0449  NA 138 140  K 4.2 4.0  CL 100* 107  CO2 30 24  GLUCOSE 102* 116*  BUN 29* 23*  CREATININE 0.65 0.49  CALCIUM 9.0 8.7*  AST 29  --   ALT 10*  --   ALKPHOS 114  --   BILITOT 0.7  --    ------------------------------------------------------------------------------------------------------------------  Cardiac Enzymes No results for input(s): TROPONINI in the last 168 hours. ------------------------------------------------------------------------------------------------------------------  RADIOLOGY:  No results found.  EKG:   Orders placed or performed during the hospital encounter of 10/16/15  . EKG 12-Lead  . EKG 12-Lead    ASSESSMENT AND PLAN:   Rachel Faulkner is a 82 y.o. female presenting with leg pain. Admitted 10/16/2015 : Day #: 1 day    2. Left tibia and fibula fracture-continue further care as per orthopedics. -Continue pain control as per orthopedics.  3. Parkinson's disease-continue Sinemet.  4. Anxiety/depression-continue Zoloft.    All the records are reviewed and case discussed with Care Management/Social Workerr. Management plans discussed with the patient, family and they are in agreement.  CODE STATUS: full TOTAL TIME TAKING CARE OF THIS PATIENT: 28 minutes.   POSSIBLE D/C IN 2-3DAYS, DEPENDING ON CLINICAL CONDITION.   Sabri Teal,  Cletis Athens M.D on  10/17/2015 at 12:46 PM  Between 7am to 6pm - Pager - [phone]  After 6pm: House Pager: - [phone]  [facility]  Office  [phone]  CC: Primary care  physician; Tillman Abideichard Letvak, MD

## 2015-10-18 ENCOUNTER — Inpatient Hospital Stay (HOSPITAL_COMMUNITY): Payer: Medicare (Managed Care) | Admitting: Anesthesiology

## 2015-10-18 ENCOUNTER — Encounter (HOSPITAL_COMMUNITY)
Admission: AD | Disposition: A | Discharge status: Skilled Nursing Facility | Payer: Self-pay | Source: Other Acute Inpatient Hospital | Attending: Orthopedic Surgery

## 2015-10-18 ENCOUNTER — Inpatient Hospital Stay (HOSPITAL_COMMUNITY): Payer: Medicare (Managed Care)

## 2015-10-18 HISTORY — PX: TIBIA IM NAIL INSERTION: SHX2516

## 2015-10-18 LAB — CBC
HCT: 32.6 % — ABNORMAL LOW (ref 36.0–46.0)
Hemoglobin: 10.1 g/dL — ABNORMAL LOW (ref 12.0–15.0)
MCH: 30.1 pg (ref 26.0–34.0)
MCHC: 31 g/dL (ref 30.0–36.0)
MCV: 97 fL (ref 78.0–100.0)
PLATELETS: 325 10*3/uL (ref 150–400)
RBC: 3.36 MIL/uL — AB (ref 3.87–5.11)
RDW: 12.6 % (ref 11.5–15.5)
WBC: 8.5 10*3/uL (ref 4.0–10.5)

## 2015-10-18 SURGERY — INSERTION, INTRAMEDULLARY ROD, TIBIA
Anesthesia: Regional | Site: Leg Lower | Laterality: Left

## 2015-10-18 MED ORDER — FENTANYL CITRATE (PF) 100 MCG/2ML IJ SOLN
INTRAMUSCULAR | Status: AC
  Administered 2015-10-18: 25 ug via INTRAVENOUS
  Filled 2015-10-18: qty 2

## 2015-10-18 MED ORDER — 0.9 % SODIUM CHLORIDE (POUR BTL) OPTIME
TOPICAL | Status: DC | PRN
  Administered 2015-10-18: 1000 mL

## 2015-10-18 MED ORDER — ONDANSETRON HCL 4 MG/2ML IJ SOLN: 4.0000 mg | Freq: Once | INTRAMUSCULAR | Status: DC | PRN

## 2015-10-18 MED ORDER — LACTATED RINGERS IV SOLN
INTRAVENOUS | Status: DC | PRN
  Administered 2015-10-18 (×2): via INTRAVENOUS

## 2015-10-18 MED ORDER — BUPIVACAINE HCL (PF) 0.5 % IJ SOLN
INTRAMUSCULAR | Status: DC | PRN
  Administered 2015-10-18: 10 mg via INTRATHECAL

## 2015-10-18 MED ORDER — FENTANYL CITRATE (PF) 100 MCG/2ML IJ SOLN
INTRAMUSCULAR | Status: AC
  Filled 2015-10-18: qty 2

## 2015-10-18 MED ORDER — FENTANYL CITRATE (PF) 100 MCG/2ML IJ SOLN
25.0000 ug | INTRAMUSCULAR | Status: DC | PRN
  Administered 2015-10-18 (×2): 25 ug via INTRAVENOUS

## 2015-10-18 MED ORDER — PROPOFOL 500 MG/50ML IV EMUL
INTRAVENOUS | Status: DC | PRN
  Administered 2015-10-18: 25 ug/kg/min via INTRAVENOUS

## 2015-10-18 MED ORDER — FENTANYL CITRATE (PF) 100 MCG/2ML IJ SOLN
25.0000 ug | Freq: Once | INTRAMUSCULAR | Status: AC
  Administered 2015-10-18: 25 ug via INTRAVENOUS

## 2015-10-18 MED ORDER — CEFAZOLIN SODIUM-DEXTROSE 2-4 GM/100ML-% IV SOLN
2.0000 g | Freq: Once | INTRAVENOUS | Status: AC
  Administered 2015-10-18: 2 g via INTRAVENOUS
  Filled 2015-10-18 (×2): qty 100

## 2015-10-18 MED ORDER — HYDROCODONE-ACETAMINOPHEN 5-325 MG PO TABS: 1.0000 | ORAL_TABLET | Freq: Four times a day (QID) | ORAL | Status: DC | PRN

## 2015-10-18 MED ORDER — BUPIVACAINE-EPINEPHRINE (PF) 0.5% -1:200000 IJ SOLN
INTRAMUSCULAR | Status: DC | PRN
  Administered 2015-10-18: 25 mL via PERINEURAL
  Administered 2015-10-18: 30 mL via PERINEURAL

## 2015-10-18 MED ORDER — MIDAZOLAM HCL 2 MG/2ML IJ SOLN
INTRAMUSCULAR | Status: AC
  Filled 2015-10-18: qty 2

## 2015-10-18 MED ORDER — LACTATED RINGERS IV SOLN
INTRAVENOUS | Status: DC
  Administered 2015-10-18: 13:00:00 via INTRAVENOUS

## 2015-10-18 MED ORDER — PHENYLEPHRINE HCL 10 MG/ML IJ SOLN
INTRAMUSCULAR | Status: DC | PRN
  Administered 2015-10-18: 20 ug/min via INTRAVENOUS

## 2015-10-18 MED ORDER — FENTANYL CITRATE (PF) 100 MCG/2ML IJ SOLN
INTRAMUSCULAR | Status: DC | PRN
  Administered 2015-10-18 (×4): 25 ug via INTRAVENOUS

## 2015-10-18 SURGICAL SUPPLY — 60 items
BANDAGE ACE 4X5 VEL STRL LF (GAUZE/BANDAGES/DRESSINGS) ×3 IMPLANT
BANDAGE ACE 6X5 VEL STRL LF (GAUZE/BANDAGES/DRESSINGS) ×3 IMPLANT
BIT DRILL CALIBRATED 4.3X320MM (BIT) ×1 IMPLANT
BIT DRILL CROWE POINT TWST 4.3 (DRILL) ×1 IMPLANT
BLADE SURG 10 STRL SS (BLADE) ×6 IMPLANT
BNDG GAUZE ELAST 4 BULKY (GAUZE/BANDAGES/DRESSINGS) ×3 IMPLANT
BRUSH SCRUB DISP (MISCELLANEOUS) ×6 IMPLANT
CLOSURE WOUND 1/2 X4 (GAUZE/BANDAGES/DRESSINGS) ×1
COTTONBALL LRG STERILE PKG (GAUZE/BANDAGES/DRESSINGS) ×3 IMPLANT
COVER SURGICAL LIGHT HANDLE (MISCELLANEOUS) ×6 IMPLANT
DRAPE C-ARM 42X72 X-RAY (DRAPES) ×3 IMPLANT
DRAPE C-ARMOR (DRAPES) ×3 IMPLANT
DRAPE EXTREMITY T 121X128X90 (DRAPE) ×6 IMPLANT
DRAPE IMP U-DRAPE 54X76 (DRAPES) ×3 IMPLANT
DRAPE INCISE IOBAN 66X45 STRL (DRAPES) IMPLANT
DRAPE U-SHAPE 47X51 STRL (DRAPES) ×3 IMPLANT
DRILL CALIBRATED 4.3X320MM (BIT) ×3
DRILL CROWE POINT TWIST 4.3 (DRILL) ×3
DRSG ADAPTIC 3X8 NADH LF (GAUZE/BANDAGES/DRESSINGS) ×3 IMPLANT
ELECT REM PT RETURN 9FT ADLT (ELECTROSURGICAL) ×3
ELECTRODE REM PT RTRN 9FT ADLT (ELECTROSURGICAL) ×1 IMPLANT
EVACUATOR 1/8 PVC DRAIN (DRAIN) IMPLANT
GAUZE SPONGE 4X4 12PLY STRL (GAUZE/BANDAGES/DRESSINGS) ×3 IMPLANT
GLOVE BIO SURGEON STRL SZ7.5 (GLOVE) ×3 IMPLANT
GLOVE BIO SURGEON STRL SZ8 (GLOVE) ×3 IMPLANT
GLOVE BIO SURGEON STRL SZ8.5 (GLOVE) ×3 IMPLANT
GLOVE BIOGEL PI IND STRL 7.5 (GLOVE) ×1 IMPLANT
GLOVE BIOGEL PI IND STRL 8 (GLOVE) ×1 IMPLANT
GLOVE BIOGEL PI INDICATOR 7.5 (GLOVE) ×2
GLOVE BIOGEL PI INDICATOR 8 (GLOVE) ×2
GOWN STRL REUS W/ TWL LRG LVL3 (GOWN DISPOSABLE) ×2 IMPLANT
GOWN STRL REUS W/ TWL XL LVL3 (GOWN DISPOSABLE) ×1 IMPLANT
GOWN STRL REUS W/TWL LRG LVL3 (GOWN DISPOSABLE) ×4
GOWN STRL REUS W/TWL XL LVL3 (GOWN DISPOSABLE) ×2
GUIDEWIRE 2.6X80 BEAD TIP (WIRE) ×1 IMPLANT
GUIDWIRE 2.6X80 BEAD TIP (WIRE) ×3
KIT BASIN OR (CUSTOM PROCEDURE TRAY) ×3 IMPLANT
KIT ROOM TURNOVER OR (KITS) ×3 IMPLANT
NAIL TIBIAL PHOENIX 9.0X300MM (Nail) ×3 IMPLANT
PACK GENERAL/GYN (CUSTOM PROCEDURE TRAY) ×3 IMPLANT
PACK UNIVERSAL I (CUSTOM PROCEDURE TRAY) ×3 IMPLANT
PAD ARMBOARD 7.5X6 YLW CONV (MISCELLANEOUS) ×6 IMPLANT
PADDING CAST ABS 4INX4YD NS (CAST SUPPLIES) ×2
PADDING CAST ABS COTTON 4X4 ST (CAST SUPPLIES) ×1 IMPLANT
SCREW CORT TI DBL LEAD 5X38 (Screw) ×3 IMPLANT
SCREW CORT TI DBL LEAD 5X40 (Screw) ×3 IMPLANT
SCREW CORT TI DBL LEAD 5X44 (Screw) ×3 IMPLANT
SCREW CORT TI DBL LEAD 5X65 (Screw) ×3 IMPLANT
SPONGE GAUZE 4X4 12PLY STER LF (GAUZE/BANDAGES/DRESSINGS) ×3 IMPLANT
STAPLER VISISTAT 35W (STAPLE) ×3 IMPLANT
STRIP CLOSURE SKIN 1/2X4 (GAUZE/BANDAGES/DRESSINGS) ×2 IMPLANT
SUT VIC AB 0 CT1 27 (SUTURE)
SUT VIC AB 0 CT1 27XBRD ANBCTR (SUTURE) IMPLANT
SUT VIC AB 2-0 CT1 27 (SUTURE)
SUT VIC AB 2-0 CT1 TAPERPNT 27 (SUTURE) IMPLANT
SUT VIC AB 2-0 CT3 27 (SUTURE) IMPLANT
TOWEL OR 17X24 6PK STRL BLUE (TOWEL DISPOSABLE) ×3 IMPLANT
TOWEL OR 17X26 10 PK STRL BLUE (TOWEL DISPOSABLE) ×6 IMPLANT
TRAY FOLEY CATH 16FRSI W/METER (SET/KITS/TRAYS/PACK) ×3 IMPLANT
YANKAUER SUCT BULB TIP NO VENT (SUCTIONS) IMPLANT

## 2015-10-18 NOTE — Brief Op Note (Signed)
10/17/2015 - 10/18/2015  10:25 PM  PATIENT:  Rachel Faulkner  82 y.o. female  PRE-OPERATIVE DIAGNOSIS:  Left tibia fracture  POST-OPERATIVE DIAGNOSIS:  Left tibia fracture  PROCEDURE:  Procedure(s): INTRAMEDULLARY (IM) NAIL TIBIAL (Left) WITH BIOMET PH[place]ENIX 9X300MM  SURGEON:  Surgeon(s) and Role:    * Myrene GalasMichael Ahmiyah Coil, MD - Primary  PHYSICIAN ASSISTANT: Montez MoritaKEITH PAUL, PAC  ANESTHESIA:   general  EBL:  No intake/output data recorded.  BLOOD ADMINISTERED:none  DRAINS: none   LOCAL MEDICATIONS USED:  NONE  SPECIMEN:  No Specimen  DISPOSITION OF SPECIMEN:  N/A  COUNTS:  YES  TOURNIQUET:  * No tourniquets in log *  DICTATION: .Other Dictation: Dictation Number [id number]  PLAN OF CARE: Admit to inpatient   PATIENT DISPOSITION:  PACU - hemodynamically stable.   Delay start of Pharmacological VTE agent (>24hrs) due to surgical blood loss or risk of bleeding: no

## 2015-10-18 NOTE — Anesthesia Procedure Notes (Signed)
Spinal  Patient location during procedure: OR Start time: 10/18/2015 2:52 PM End time: 10/18/2015 2:54 PM Staffing Anesthesiologist: Linton RumpALLAN, JENNIFER DICKERSON Performed: anesthesiologist  Preanesthetic Checklist Completed: patient identified, surgical consent, pre-op evaluation, timeout performed, IV checked, risks and benefits discussed and monitors and equipment checked Spinal Block Patient position: sitting Prep: ChloraPrep Patient monitoring: heart rate, cardiac monitor, continuous pulse ox and blood pressure Approach: midline Location: L2-3 Injection technique: single-shot Needle Needle type: Quincke  Needle gauge: 22 G Needle length: 9 cm

## 2015-10-18 NOTE — Progress Notes (Signed)
Orthopedic Tech Progress Note Patient Details:  Lanier PrudeLila V Monteith [birth year] [id number] Assisted Montez MoritaKeith Paul in application of knee immobilizer per his request. Ortho Devices Type of Ortho Device: Knee Immobilizer Ortho Device/Splint Location: Lt Knee Ortho Device/Splint Interventions: Ordered, Application   Clois Dupesvery S Thierry Dobosz 10/18/2015, 9:35 AM

## 2015-10-18 NOTE — Transfer of Care (Signed)
Immediate Anesthesia Transfer of Care Note  Patient: Rachel Faulkner  Procedure(s) Performed: Procedure(s): INTRAMEDULLARY (IM) NAIL TIBIAL (Left)  Patient Location: PACU  Anesthesia Type:MAC combined with regional for post-op pain  Level of Consciousness: awake, alert  and patient cooperative  Airway & Oxygen Therapy: Patient Spontanous Breathing and Patient connected to nasal cannula oxygen  Post-op Assessment: Report given to RN, Post -op Vital signs reviewed and stable and Patient moving all extremities X 4  Post vital signs: Reviewed and stable  Last Vitals:  Vitals:   10/18/15 1355 10/18/15 1400  BP: (!) 126/59 (!) 158/50  Pulse:    Resp: 20 18  Temp:      Last Pain:  Vitals:   10/18/15 0923  TempSrc: Oral  PainSc:       Patients Stated Pain Goal: 2 (10/17/15 2100)  Complications: No apparent anesthesia complications

## 2015-10-18 NOTE — Anesthesia Preprocedure Evaluation (Addendum)
Anesthesia Evaluation  Patient identified by MRN, date of birth, ID band Patient awake    Reviewed: Allergy & Precautions, NPO status , Patient's Chart, lab work & pertinent test results  History of Anesthesia Complications Negative for: history of anesthetic complications  Airway Mallampati: III  TM Distance: >3 FB Neck ROM: Full    Dental  (+) Edentulous Upper, Edentulous Lower   Pulmonary neg shortness of breath, neg sleep apnea, neg COPD, neg recent URI, former smoker,    Pulmonary exam normal breath sounds clear to auscultation       Cardiovascular Exercise Tolerance: Poor (-) hypertension(-) angina(-) Past MI, (-) Cardiac Stents and (-) CABG  Rhythm:Regular Rate:Normal  EKG 10/16/2015: NSR   Neuro/Psych neg Seizures PSYCHIATRIC DISORDERS Anxiety Depression Dementia, Parkinson's disease    GI/Hepatic negative GI ROS, Neg liver ROS,   Endo/Other  negative endocrine ROS  Renal/GU negative Renal ROS  Female GU complaint     Musculoskeletal  (+) Arthritis , osteoporosis   Abdominal   Peds  Hematology  (+) Blood dyscrasia, anemia ,   Anesthesia Other Findings   Reproductive/Obstetrics                          Anesthesia Physical Anesthesia Plan  ASA: III  Anesthesia Plan: Regional and Spinal   Post-op Pain Management:    Induction: Intravenous  Airway Management Planned: Natural Airway and Simple Face Mask  Additional Equipment:   Intra-op Plan:   Post-operative Plan:   Informed Consent: I have reviewed the patients History and Physical, chart, labs and discussed the procedure including the risks, benefits and alternatives for the proposed anesthesia with the patient or authorized representative who has indicated his/her understanding and acceptance.   Consent reviewed with POA  Plan Discussed with: CRNA  Anesthesia Plan Comments: (I have discussed risks of neuraxial  anesthesia including but not limited to infection, bleeding, nerve injury, back pain, headache, seizures, and failure of block. Patient denies bleeding disorders and is not currently anticoagulated. Labs have been reviewed. Risks and benefits discussed. All patient's questions answered.   Discussed potential risks of nerve blocks including, but not limited to, infection, bleeding, nerve damage, seizures, pneumothorax, respiratory depression, and potential failure of the block. Alternatives to nerve blocks discussed. All questions answered.  Discussed backup GA and risks.  Platelets 325 INR 0.99 )      Anesthesia Quick Evaluation

## 2015-10-18 NOTE — Progress Notes (Addendum)
Called short stay gave report for surgery

## 2015-10-18 NOTE — Anesthesia Procedure Notes (Signed)
Anesthesia Regional Block:  Nerve block type: Subgluteal sciatic.  Pre-Anesthetic Checklist: ,, timeout performed, Correct Patient, Correct Site, Correct Laterality, Correct Procedure, Correct Position, site marked, Risks and benefits discussed,  Surgical consent,  Pre-op evaluation,  At surgeon's request and post-op pain management  Laterality: Left  Prep: chloraprep       Needles:  Injection technique: Single-shot  Needle Type: Echogenic Stimulator Needle     Needle Length: 9cm 9 cm Needle Gauge: 21 and 21 G    Additional Needles:  Procedures: ultrasound guided (picture in chart) and nerve stimulator Nerve block type: Subgluteal sciatic.  Nerve Stimulator or Paresthesia:  Response: Dorsiflexion and Plantar flexion, 0.5 mA,   Additional Responses:   Narrative:  Start time: 10/18/2015 1:40 PM End time: 10/18/2015 1:45 PM Injection made incrementally with aspirations every 5 mL.  Performed by: Personally  Anesthesiologist: Linton RumpALLAN, Ardell Makarewicz DICKERSON

## 2015-10-18 NOTE — Clinical Social Work Note (Signed)
Clinical Social Work Assessment  Patient Details  Name: Rachel Faulkner MRN: [id number] Date of Birth: 04-May-1914  Date of referral:  10/18/15               Reason for consult:  Facility Placement                Permission sought to share information with:   Engineer, building services(Facility) Permission granted to share information::   Engineer, building services(Facility)  Name::        Agency::     Relationship::     Contact Information:     Housing/Transportation Living arrangements for the past 2 months:  Assisted Living Facility Source of Information:  Adult Children, Power of Attorney Rachel Ruiz(John [phone]) Patient Interpreter Needed:  None Criminal Activity/Legal Involvement Pertinent to Current Situation/Hospitalization:  No - Comment as needed Significant Relationships:  Adult Children Lives with:  Facility Resident [facility]([place]) Do you feel safe going back to the place where you live?  Yes Need for family participation in patient care:  Yes (Comment)  Care giving concerns:  Son/POA Rachel Ruiz(John) states that due to pt falling off of a lift Friday while at a facility she will need assistance with ADLs.   Social Worker assessment / plan:  SW attempted to speak with patient at bedside. However, pt was asleep. Family was present. SW spoke with son who states that [facility], and has been living there for 10 years. He states that upon discharge he would like for pt to return to facility and complete rehab there. SW will notify facility.  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  Patient/Family's Understanding of and Emotional Response to Diagnosis, Current Treatment, and Prognosis:  No questions for SW at this time.  Emotional Assessment Appearance:  Appears stated age Attitude/Demeanor/Rapport:   (Appropriate) Affect (typically observed):   Accepting Orientation:  Oriented to Self Alcohol / Substance use:  Not Applicable Psych involvement (Current and /or in the community):  No (Comment)  Discharge Needs  Concerns to be addressed:  No discharge needs identified Readmission within the last 30 days:  No Current discharge risk:  None Barriers to Discharge:  No Barriers Identified   Harlon FlorWhitaker, Chassidy Layson R 10/18/2015, 1:06 PM

## 2015-10-18 NOTE — Anesthesia Postprocedure Evaluation (Signed)
Anesthesia Post Note  Patient: Rachel Faulkner  Procedure(s) Performed: Procedure(s) (LRB): INTRAMEDULLARY (IM) NAIL TIBIAL (Left)  Patient location during evaluation: PACU Anesthesia Type: General Level of consciousness: awake and alert and patient cooperative Pain management: pain level controlled Vital Signs Assessment: post-procedure vital signs reviewed and stable Respiratory status: spontaneous breathing and respiratory function stable Cardiovascular status: stable Anesthetic complications: no    Last Vitals:  Vitals:   10/18/15 1815 10/18/15 1830  BP:  (!) 187/64  Pulse: 65 64  Resp: 17 14  Temp:  36.7 C    Last Pain:  Vitals:   10/18/15 1745  TempSrc:   PainSc: 0-No pain                 [name] S

## 2015-10-18 NOTE — NC FL2 (Signed)
[place] MEDICAID FL2 LEVEL OF CARE SCREENING TOOL     IDENTIFICATION  Patient Name: Rachel Faulkner Birthdate: 17-Jun-1914 Sex: female Admission Date (Current Location): 10/17/2015  [facility] [place] [place]   Facility and Address:  [facility] [place], [address], [place], [place] [address]      Provider Number: [id number]  Attending Physician Name and Address:  Myrene GalasMichael Handy, MD  Relative Name and Phone Number:       Current Level of Care: Hospital Recommended Level of Care: Skilled Nursing Facility Prior Approval Number:    Date Approved/Denied:   PASRR Number:  [id number])  Discharge Plan: SNF    Current Diagnoses: Patient Active Problem List   Diagnosis Date Noted  . Tibia fracture 10/17/2015  . Fx upper tibia/fibula-closed 10/16/2015  . Bilateral hip fractures (HCC) 01/14/2015  . DEMENTIA 10/26/2006  . ANXIETY 10/26/2006  . DEPRESSION 10/26/2006  . PARKINSON'S DISEASE 10/26/2006  . OSTEOARTHRITIS 10/26/2006  . OSTEOPOROSIS 10/26/2006  . URINARY INCONTINENCE 10/26/2006    Orientation RESPIRATION BLADDER Height & Weight     Self, Time, Situation, Place   (Nurse states oxygen needed when pain medication given) Incontinent Weight:   Height:     BEHAVIORAL SYMPTOMS/MOOD NEUROLOGICAL BOWEL NUTRITION STATUS      Incontinent  (Normal)  AMBULATORY STATUS COMMUNICATION OF NEEDS Skin   Extensive Assist   Normal                       Personal Care Assistance Level of Assistance  Bathing, Dressing Bathing Assistance: Maximum assistance Feeding assistance: Independent Dressing Assistance: Maximum assistance     Functional Limitations Info             SPECIAL CARE FACTORS FREQUENCY                       Contractures      Additional Factors Info                  Current Medications (10/18/2015):  This is the current hospital active medication list Current Facility-Administered Medications   Medication Dose Route Frequency Provider Last Rate Last Dose  . [MAR Hold] acetaminophen (TYLENOL) tablet 650 mg  650 mg Oral Q6H PRN Montez MoritaKeith Paul, PA-C   650 mg at 10/17/15 2110   Or  . [MAR Hold] acetaminophen (TYLENOL) suppository 650 mg  650 mg Rectal Q6H PRN Montez MoritaKeith Paul, PA-C      . [MAR Hold] albuterol (PROVENTIL) (2.5 MG/3ML) 0.083% nebulizer solution 2.5 mg  2.5 mg Nebulization Q4H PRN Montez MoritaKeith Paul, PA-C      . [MAR Hold] alum & mag hydroxide-simeth (MAALOX/MYLANTA) 200-200-20 MG/5ML suspension 30 mL  30 mL Oral Q4H PRN Montez MoritaKeith Paul, PA-C      . [MAR Hold] bisacodyl (DULCOLAX) suppository 10 mg  10 mg Rectal Daily PRN Montez MoritaKeith Paul, PA-C      . [MAR Hold] carbidopa-levodopa (SINEMET IR) 25-100 MG per tablet immediate release 1 tablet  1 tablet Oral QID Montez MoritaKeith Paul, PA-C   1 tablet at 10/17/15 2111  . [MAR Hold] ceFAZolin (ANCEF) IVPB 2g/100 mL premix  2 g Intravenous Once Montez MoritaKeith Paul, PA-C      . [MAR Hold] enoxaparin (LOVENOX) injection 30 mg  30 mg Subcutaneous Q24H Montez MoritaKeith Paul, PA-C      . fentaNYL (SUBLIMAZE) 100 MCG/2ML injection           . [MAR Hold] furosemide (LASIX)  tablet 20 mg  20 mg Oral Daily Montez Morita, PA-C      . [MAR Hold] HYDROcodone-acetaminophen (NORCO/VICODIN) 5-325 MG per tablet 1-2 tablet  1-2 tablet Oral Q6H PRN Montez Morita, PA-C      . lactated ringers infusion   Intravenous Continuous Montez Morita, PA-C 100 mL/hr at 10/18/15 0950    . midazolam (VERSED) 2 MG/2ML injection           . [MAR Hold] multivitamin with minerals tablet 1 tablet  1 tablet Oral QHS Montez Morita, PA-C   1 tablet at 10/17/15 2111  . [MAR Hold] pramipexole (MIRAPEX) tablet 0.25 mg  0.25 mg Oral TID Montez Morita, PA-C   0.25 mg at 10/17/15 2110  . [MAR Hold] sertraline (ZOLOFT) tablet 25 mg  25 mg Oral QHS Montez Morita, PA-C   25 mg at 10/17/15 2111     Discharge Medications: Please see discharge summary for a list of discharge medications.  Relevant Imaging Results:  Relevant Lab Results:   Additional  Information  (SS: [id number])  Harlon Flor, Genia Plants

## 2015-10-18 NOTE — Consult Note (Signed)
Orthopaedic Trauma Service (OTS) Consult   Reason for Consult: L proximal tibia fracture Referring Physician:  Wynetta Emery, MD (ortho, [facility])   HPI: Rachel Faulkner is an 82 y.o. white female who is a long term resident at [facility] facility in [place]. Pt has dementia and parkinsons. She somehow fell out of the hoyer lift at the facility sustaining an injury to her L leg. Patient presented to the orthopedic office in [place]. She was admitted to the hospital with left proximal tibia fracture. Hospitalist was consulted for management of medical issues as well. Due to the complexity of the injury orthopedic trauma service was contacted regarding this course of treatment. Patient was subsequently transferred to [place] yesterday evening for definitive management.  Patient's son and daughter-in-law are at bedside and they provide a lot of information regarding the patient's health and activity level. She is essentially nonambulatory. She has been this way for about 5 years after sustaining a right hip fracture which was operated on. She did sustained bilateral femur fractures a year ago. On the left was treated surgically by Dr. Mack Guise. Family indicates that she really is bed to chair. She does sometimes bear weight on her lower extremities to assist with transfers but is wheelchair dependent. Patient's really only medical issues are dementia, osteoporosis and arthritis. She does have some urinary incontinence as well. She is a full-time resident at [facility]   Patient was deemed low to moderate risk for surgery by the hospital at [facility]. EKG did not show any acute ST or T-wave changes.  Discussed CODE STATUS with the son: Patient is a DO NOT RESUSCITATE  Past Medical History:  Diagnosis Date  . Anxiety   . Arthritis   . Dementia without behavioral disturbance   . Depression   . Osteoporosis   . Parkinson disease ([place])   . Urinary incontinence      Past Surgical History:  Procedure Laterality Date  . APPENDECTOMY    . COLONOSCOPY W/ POLYPECTOMY  ?1980's  . FEMUR IM NAIL Left 01/15/2015   Procedure: INTRAMEDULLARY (IM) NAIL FEMORAL;  Surgeon: Thornton Park, MD;  Location: [facility] ORS;  Service: Orthopedics;  Laterality: Left;  femur  . TONSILLECTOMY      Family History  Problem Relation Age of Onset  . Diabetes Neg Hx   . Hypertension Neg Hx   . Breast cancer Neg Hx   . Colon cancer Neg Hx     Social History:  reports that she has quit smoking. She quit after 2.00 years of use. She has quit using smokeless tobacco. She reports that she does not drink alcohol or use drugs.  Allergies:  Allergies  Allergen Reactions  . Codeine Other (See Comments)    Reaction:  Unknown   . Sulfa Antibiotics Other (See Comments)    Reaction:  Unknown     Medications:  I have reviewed the patient's current medications. Prior to Admission:  Prescriptions Prior to Admission  Medication Sig Dispense Refill Last Dose  . acetaminophen (TYLENOL ARTHRITIS PAIN) 650 MG CR tablet Take 650 mg by mouth 2 (two) times daily.    Past Week at unknown  . acetaminophen (TYLENOL) 325 MG tablet Take 650 mg by mouth 3 (three) times daily as needed for mild pain.   Past Week at Unknown time  . albuterol (PROVENTIL HFA;VENTOLIN HFA) 108 (90 BASE) MCG/ACT inhaler Inhale 2 puffs into the lungs every 4 (four) hours as needed for wheezing or shortness of  breath.   Past Week at Unknown time  . alum & mag hydroxide-simeth (MAALOX/MYLANTA) 200-200-20 MG/5ML suspension Take 30 mLs by mouth every 4 (four) hours as needed for indigestion, heartburn or flatulence.   unknown at unknown  . aspirin 81 MG chewable tablet Chew 81 mg by mouth daily.   10/16/2015 at am  . bisacodyl (DULCOLAX) 10 MG suppository Place 1 suppository (10 mg total) rectally daily as needed for moderate constipation. 12 suppository 0 prn at prn  . carbidopa-levodopa (SINEMET) 25-100 MG per tablet Take  1 tablet by mouth 4 (four) times daily.     10/16/2015 at 1200  . furosemide (LASIX) 20 MG tablet Take 20 mg by mouth daily.    10/16/2015 at am  . HYDROcodone-acetaminophen (NORCO/VICODIN) 5-325 MG per tablet Take 1 tablet by mouth every 4 (four) hours as needed (pain). (Patient taking differently: Take 1 tablet by mouth every 4 (four) hours as needed for moderate pain. ) 30 tablet  prn at prn  . loratadine (CLARITIN) 10 MG tablet Take 10 mg by mouth daily.   10/16/2015 at am  . Multiple Vitamin (MULTIVITAMIN WITH MINERALS) TABS tablet Take 1 tablet by mouth at bedtime.   Past Week at pm  . Multiple Vitamins-Minerals (PRESERVISION AREDS 2 PO) Take 1 tablet by mouth daily at 12 noon.   10/16/2015 at pm  . polyethylene glycol (MIRALAX / GLYCOLAX) packet Take 17 g by mouth daily as needed for mild constipation. 14 each 0 prn at prn  . pramipexole (MIRAPEX) 0.25 MG tablet Take 0.25 mg by mouth 3 (three) times daily.     10/16/2015 at am  . sertraline (ZOLOFT) 25 MG tablet Take 25 mg by mouth at bedtime.    Past Week at pm  . Vitamin D, Ergocalciferol, (DRISDOL) 50000 UNITS CAPS capsule Take 50,000 Units by mouth every 30 (thirty) days. Pt takes on the 24th of every month.   Past Month at am    Results for orders placed or performed during the hospital encounter of 10/17/15 (from the past 48 hour(s))  Glucose, capillary     Status: Abnormal   Collection Time: 10/17/15  5:40 PM  Result Value Ref Range   Glucose-Capillary 109 (H) 65 - 99 mg/dL  CBC     Status: Abnormal   Collection Time: 10/17/15  8:27 PM  Result Value Ref Range   WBC 8.5 4.0 - 10.5 K/uL   RBC 3.41 (L) 3.87 - 5.11 MIL/uL   Hemoglobin 10.3 (L) 12.0 - 15.0 g/dL   HCT 32.8 (L) 36.0 - 46.0 %   MCV 96.2 78.0 - 100.0 fL   MCH 30.2 26.0 - 34.0 pg   MCHC 31.4 30.0 - 36.0 g/dL   RDW 12.8 11.5 - 15.5 %   Platelets 357 150 - 400 K/uL  Creatinine, serum     Status: None   Collection Time: 10/17/15  8:27 PM  Result Value Ref Range    Creatinine, Ser 0.54 0.44 - 1.00 mg/dL   GFR calc non Af Amer >60 >60 mL/min   GFR calc Af Amer >60 >60 mL/min    Comment: (NOTE) The eGFR has been calculated using the CKD EPI equation. This calculation has not been validated in all clinical situations. eGFR's persistently <60 mL/min signify possible Chronic Kidney Disease.   Results for GAILYA, TAUER (MRN [id number]) as of 10/18/2015 09:58  Ref. Range 10/16/2015 14:13  Prothrombin Time Latest Ref Range: 11.4 - 15.2 seconds 13.1  INR Unknown 0.99  APTT Latest Ref Range: 24 - 36 seconds 24   Results for NAFEESA, DILS (MRN [id number]) as of 10/18/2015 09:58  Ref. Range 10/16/2015 14:13  Sample Expiration Unknown 10/19/2015  Antibody Screen Unknown NEG  ABO/RH(D) Unknown A NEG  Results for ALGA, SOUTHALL (MRN [id number]) as of 10/18/2015 09:58  Ref. Range 10/17/2015 04:49  Sodium Latest Ref Range: 135 - 145 mmol/L 140  Potassium Latest Ref Range: 3.5 - 5.1 mmol/L 4.0  Chloride Latest Ref Range: 101 - 111 mmol/L 107  CO2 Latest Ref Range: 22 - 32 mmol/L 24  BUN Latest Ref Range: 6 - 20 mg/dL 23 (H)  Creatinine Latest Ref Range: 0.44 - 1.00 mg/dL 0.49  Calcium Latest Ref Range: 8.9 - 10.3 mg/dL 8.7 (L)  EGFR (Non-African Amer.) Latest Ref Range: >60 mL/min >60  EGFR (African American) Latest Ref Range: >60 mL/min >60  Glucose Latest Ref Range: 65 - 99 mg/dL 116 (H)  Anion gap Latest Ref Range: 5 - 15  9   Dg Chest Port 1 View  Result Date: 10/17/2015 CLINICAL DATA:  82 year old who may have fallen at the nursing home earlier today. Right tibial fracture. Preoperative respiratory evaluation. EXAM: PORTABLE CHEST 1 VIEW COMPARISON:  01/14/2015, 09/06/2013 and earlier. FINDINGS: Cardiac silhouette mildly enlarged for AP portable technique. Thoracic aorta atherosclerotic. Hilar and mediastinal contours otherwise unremarkable. Calcified granuloma in the left upper lobe, unchanged. Lungs otherwise clear. Pulmonary vascularity normal. No  visible pleural effusions. IMPRESSION: Stable mild cardiomegaly.  No acute cardiopulmonary disease. Electronically Signed   By: Evangeline Dakin M.D.   On: 10/17/2015 20:30   Dg Tibia/fibula Left Port  Result Date: 10/17/2015 CLINICAL DATA:  82 year old who may have fallen at the nursing home, though presents with an obvious left lower leg deformity. Initial encounter. EXAM: PORTABLE LEFT TIBIA AND FIBULA - 2 VIEW COMPARISON:  None. FINDINGS: Examination is performed in fiberglass splint material. Comminuted transverse fracture involving the proximal tibial metaphysis. Knee joint remains anatomically aligned. No visible fibular fracture. Visualized ankle joint intact. Severe generalized osseous demineralization. IMPRESSION: Comminuted transverse fracture involving the proximal tibial metaphysis. Electronically Signed   By: Evangeline Dakin M.D.   On: 10/17/2015 20:29    Review of Systems  Unable to perform ROS: Dementia   Blood pressure (!) 177/58, pulse 65, temperature 99 F (37.2 C), temperature source Oral, resp. rate 18, SpO2 100 %. Physical Exam  Constitutional: She is cooperative.  Comfortable appearing elderly female   Cardiovascular: Normal rate, regular rhythm, S1 normal and S2 normal.   Pulmonary/Chest:  Clear anterior fields   Abdominal: Normal appearance and bowel sounds are normal. She exhibits no distension. There is no tenderness.  Musculoskeletal:  Left Lower Extremity  Inspection:    Long leg posterior splint applied    Knee in flexion     Proximal tibia fx site in recurvatum     Leg is shortened     Ace wrap bunched up around fx site/proximal tibia   Bony eval:    TTP L proximal tibia     Profound deformity     Gross motion at fracture site     TTP L foot and ankle     Hip w/o pain on palpation   Soft tissue:    Ace wrap resting on skin    This was removed to further eval soft tissues    Extensive ecchymosis to proximal L lower leg    Skin of proximal lower  leg is wrinkled due to  shortened nature    No open wounds appreciated    Some erythema to L lower leg, proximal to ankle. Likely irritation from ace wrap. No wounds noted in this area     Soft tissues to L thigh stable    Mild swelling to L ankle and foot    Dystrophic nail changes noted B   ROM:    Did not evaluate ROM of knee or hip    Pt tolerates gentle ROM of L ankle but slightly limited due to pain     Sensation:    Difficult to ascertain but DPN, SPN, TN sensation grossly intact   Motor:    EHL, FHL, lesser toe motor function grossly intact    Pt moves ankle a little     She did assist with holding her leg up when removing splint and dressings while her leg was supported   Vascular:   + DP pulse    Ext warm    Compartments are soft     Nursing note and vitals reviewed.         Vitals:   10/17/15 1734 10/17/15 2236 10/18/15 0526 10/18/15 0923  BP: (!) 166/48 (!) 115/34 (!) 158/48 (!) 177/58  Pulse: 65 66 65 65  Resp:   18   Temp: 98.4 F (36.9 C) 98.6 F (37 C) 98.4 F (36.9 C) 99 F (37.2 C)  TempSrc: Oral Oral Oral Oral  SpO2: 95% 97% 95% 100%    Assessment/Plan:  82 y/o female s/p fall out of a hoyer lift with L proximal tibia fracture   -fall from hoyer lift  - L proximal tibia fracture  Would recommend operative intervention to restore length, alignment and stability to L tibia  Surgical stabilization will also allow soft tissue to recover from this trauma   Pts soft tissue is in poor condition and do not feel that she would tolerate non-op intervention    Fixation of her fracture will also enable pt to perform bed to chair transfers more easily   She will have unrestricted ROM of her L knee post op   She will be NWB on her L leg for 6-8 weeks (family reports that she does bear weight sometimes for transfers)   I did remove ace wrap and splint and place pt in knee immobilizer with additional support under knee and fracture site.    Plan for  OR in next several hours    Did discuss with family that pts bone quality is poor from disuse and this increases her healing time. We also discussed the possibility of nonunion, infection. Discussed risks associated with surgery/anesthesia including but not limited to MI, stroke, death.   We believe that the benefit of surgery outweighs the risk   Family in agreement and wish to proceed    Plan is to return to [place] [facility] at [place]  - Pain management:  Continue with tylenol  Pt had oxycodone at [facility] and family states that this made the pt hyper and act bizarre    I do think the pt needs something more that just tylenol periop--->elevated SBP may be related to pain     Have ordered norco 5/325: 1 q8h prn severe pain   - ABL anemia/Hemodynamics  CBC stable yesterday  Monitor   Systolic HTN and Wide pulse pressure   Widened pulse pressure likely related to age   Pt w/o hx of HTN    Likely related to pain    Will monitor   -  Medical issues   Dementia/parkinsons   Home meds  - DVT/PE prophylaxis:  lovenox post op and for 21 days post op  - ID:   periop ancef  - Metabolic Bone Disease:  Pt with hx of osteoporosis, further exacerbated by limited activity and really no WB activity   Pt on Vitamin D monthly   Check vitamin D levels  - Activity:  PT/OT consults post op  - FEN/GI prophylaxis/Foley/Lines:  NPO for now   Swallowing eval postoperatively due to numerous risk factors including anesthesia, dementia and poor dentition  - Impediments to fracture healing:  Severe osteoporosis   Dementia   - CODE STATUS: DO NOT RESUSCITATE  - Dispo:  OR for IMN L tibia    Jari Pigg, PA-C [facility] [phone] (P) 10/18/2015, 9:30 AM

## 2015-10-18 NOTE — Anesthesia Procedure Notes (Signed)
Anesthesia Regional Block:  Femoral nerve block  Pre-Anesthetic Checklist: ,, timeout performed, Correct Patient, Correct Site, Correct Laterality, Correct Procedure, Correct Position, site marked, Risks and benefits discussed,  Surgical consent,  Pre-op evaluation,  At surgeon's request and post-op pain management  Laterality: Left  Prep: chloraprep       Needles:  Injection technique: Single-shot  Needle Type: Echogenic Stimulator Needle     Needle Length: 9cm 9 cm Needle Gauge: 21 and 21 G    Additional Needles:  Procedures: ultrasound guided (picture in chart) Femoral nerve block Narrative:  Start time: 10/18/2015 1:25 PM End time: 10/18/2015 1:30 PM Injection made incrementally with aspirations every 5 mL.  Performed by: Personally  Anesthesiologist: Linton RumpALLAN, Keyton Bhat DICKERSON

## 2015-10-18 NOTE — Progress Notes (Signed)
Pt with incidences of incontinence. Unable to provide urine specimen. Will continue to try and obtain specimen.

## 2015-10-18 NOTE — OR Nursing (Signed)
attempted to insert foley catheter intra op with 83F and 33F catheters without success.

## 2015-10-19 ENCOUNTER — Encounter (HOSPITAL_COMMUNITY): Payer: Self-pay | Admitting: Orthopedic Surgery

## 2015-10-19 DIAGNOSIS — R32 Unspecified urinary incontinence: Secondary | ICD-10-CM | POA: Diagnosis present

## 2015-10-19 DIAGNOSIS — F329 Major depressive disorder, single episode, unspecified: Secondary | ICD-10-CM | POA: Diagnosis present

## 2015-10-19 DIAGNOSIS — F0390 Unspecified dementia without behavioral disturbance: Secondary | ICD-10-CM | POA: Diagnosis present

## 2015-10-19 DIAGNOSIS — G20 Parkinson's disease: Secondary | ICD-10-CM | POA: Diagnosis present

## 2015-10-19 DIAGNOSIS — G20A1 Parkinson's disease without dyskinesia, without mention of fluctuations: Secondary | ICD-10-CM | POA: Diagnosis present

## 2015-10-19 DIAGNOSIS — F32A Depression, unspecified: Secondary | ICD-10-CM | POA: Diagnosis present

## 2015-10-19 DIAGNOSIS — F419 Anxiety disorder, unspecified: Secondary | ICD-10-CM | POA: Diagnosis present

## 2015-10-19 LAB — CBC
HCT: 29.1 % — ABNORMAL LOW (ref 36.0–46.0)
Hemoglobin: 9.1 g/dL — ABNORMAL LOW (ref 12.0–15.0)
MCH: 29.8 pg (ref 26.0–34.0)
MCHC: 31.3 g/dL (ref 30.0–36.0)
MCV: 95.4 fL (ref 78.0–100.0)
PLATELETS: 335 10*3/uL (ref 150–400)
RBC: 3.05 MIL/uL — ABNORMAL LOW (ref 3.87–5.11)
RDW: 12.6 % (ref 11.5–15.5)
WBC: 9.4 10*3/uL (ref 4.0–10.5)

## 2015-10-19 LAB — BASIC METABOLIC PANEL
Anion gap: 7 (ref 5–15)
BUN: 9 mg/dL (ref 6–20)
CHLORIDE: 103 mmol/L (ref 101–111)
CO2: 26 mmol/L (ref 22–32)
CREATININE: 0.55 mg/dL (ref 0.44–1.00)
Calcium: 8.6 mg/dL — ABNORMAL LOW (ref 8.9–10.3)
GFR calc non Af Amer: >60 mL/min (ref >60)
Glucose, Bld: 93 mg/dL (ref 65–99)
POTASSIUM: 3.7 mmol/L (ref 3.5–5.1)
SODIUM: 136 mmol/L (ref 135–145)

## 2015-10-19 LAB — CALCITRIOL (1,25 DI-OH VIT D): VIT D 1 25 DIHYDROXY: 73 pg/mL (ref 19.9–79.3)

## 2015-10-19 LAB — VITAMIN D 25 HYDROXY (VIT D DEFICIENCY, FRACTURES): VIT D 25 HYDROXY: 35.7 ng/mL (ref 30.0–100.0)

## 2015-10-19 MED ORDER — ENSURE ENLIVE PO LIQD
237.0000 mL | ORAL | Status: DC
  Administered 2015-10-20: 237 mL via ORAL

## 2015-10-19 MED ORDER — METOCLOPRAMIDE HCL 5 MG PO TABS: 5.0000 mg | ORAL_TABLET | Freq: Three times a day (TID) | ORAL | Status: DC | PRN

## 2015-10-19 MED ORDER — CEFAZOLIN IN D5W 1 GM/50ML IV SOLN
1.0000 g | Freq: Four times a day (QID) | INTRAVENOUS | Status: AC
  Administered 2015-10-19 (×3): 1 g via INTRAVENOUS
  Filled 2015-10-19 (×3): qty 50

## 2015-10-19 MED ORDER — HYDROCODONE-ACETAMINOPHEN 5-325 MG PO TABS: 1.0000 | ORAL_TABLET | Freq: Three times a day (TID) | ORAL | 0 refills | Status: AC | PRN

## 2015-10-19 MED ORDER — ACETAMINOPHEN 500 MG PO TABS: 500.0000 mg | ORAL_TABLET | Freq: Four times a day (QID) | ORAL | 0 refills | Status: AC | PRN

## 2015-10-19 MED ORDER — ENOXAPARIN SODIUM 30 MG/0.3ML ~~LOC~~ SOLN
30.0000 mg | SUBCUTANEOUS | Status: DC
  Filled 2015-10-19: qty 0.3

## 2015-10-19 MED ORDER — ENOXAPARIN SODIUM 30 MG/0.3ML ~~LOC~~ SOLN: 30.0000 mg | SUBCUTANEOUS | 0 refills | Status: AC

## 2015-10-19 MED ORDER — ACETAMINOPHEN 650 MG RE SUPP: 650.0000 mg | Freq: Four times a day (QID) | RECTAL | Status: DC | PRN

## 2015-10-19 MED ORDER — ONDANSETRON HCL 4 MG/2ML IJ SOLN: 4.0000 mg | Freq: Four times a day (QID) | INTRAMUSCULAR | Status: DC | PRN

## 2015-10-19 MED ORDER — METOCLOPRAMIDE HCL 5 MG/ML IJ SOLN: 5.0000 mg | Freq: Three times a day (TID) | INTRAMUSCULAR | Status: DC | PRN

## 2015-10-19 MED ORDER — ACETAMINOPHEN 500 MG PO TABS
500.0000 mg | ORAL_TABLET | Freq: Four times a day (QID) | ORAL | Status: DC | PRN
  Administered 2015-10-20 (×2): 500 mg via ORAL
  Filled 2015-10-19 (×2): qty 1

## 2015-10-19 MED ORDER — ONDANSETRON HCL 4 MG PO TABS: 4.0000 mg | ORAL_TABLET | Freq: Four times a day (QID) | ORAL | 0 refills | Status: AC | PRN

## 2015-10-19 MED ORDER — ONDANSETRON HCL 4 MG PO TABS: 4.0000 mg | ORAL_TABLET | Freq: Four times a day (QID) | ORAL | Status: DC | PRN

## 2015-10-19 NOTE — Evaluation (Signed)
Physical Therapy Evaluation Patient Details Name: Rachel Faulkner MRN: [id number] DOB: 06/09/1914 Today's Date: 10/19/2015   History of Present Illness  Rachel V Killmeieris an 82 y.o.white femalewho is a long term resident at [facility] facility in [place]. Pt has dementia and parkinsons. She somehow fell out of the hoyer lift at the facility sustaining L proximal  tibia fracture. Underwent surgery on 9/28. Per family, she is essentially nonambulatory. She has been for about 5 years after sustaining a right hip fracture. She does sometimes bear weight on her lower extremities to assist with transfers but is wheelchair dependent.  Clinical Impression  Patient is s/p above surgery resulting in functional limitations due to the deficits listed below (see PT Problem List). Patient was dependent on wheelchair, however did stand-pivot transfers with weight-bearing with 2 person assist prior to this fall.  Patient may benefit from skilled PT to increase their independence and safety with mobility to allow discharge to the venue listed below.       Follow Up Recommendations SNF;Supervision/Assistance - 24 hour    Equipment Recommendations  None recommended by PT    Recommendations for Other Services       Precautions / Restrictions Precautions Precautions: Fall Restrictions LLE Weight Bearing: Non weight bearing      Mobility  Bed Mobility Overal bed mobility: Needs Assistance Bed Mobility: Rolling;Sidelying to Sit;Sit to Supine Rolling: Max assist Sidelying to sit: Total assist;+2 for physical assistance;+2 for safety/equipment;HOB elevated   Sit to supine: Total assist;+2 for physical assistance   General bed mobility comments: Can intiate rolling and once grabs rail she will pull/push on rail to assist  Transfers                 General transfer comment: deferred due to NWB status; will need lift until allowed to weight bear  Ambulation/Gait                 Stairs            Wheelchair Mobility    Modified Rankin (Stroke Patients Only)       Balance Overall balance assessment: Needs assistance;History of Falls Sitting-balance support: No upper extremity supported;Feet unsupported Sitting balance-Leahy Scale: Poor Sitting balance - Comments: maintains her midline balance up to 2 minutes at a time; fatigues with posterior lean Postural control: Posterior lean                                   Pertinent Vitals/Pain Pain Assessment: Faces Faces Pain Scale: Hurts little more Pain Location: LLE Pain Descriptors / Indicators: Sore Pain Intervention(s): Limited activity within patient's tolerance;Monitored during session;Repositioned    Home Living Family/patient expects to be discharged to:: Skilled nursing facility                 Additional Comments: long-term resident [facility]    Prior Function Level of Independence: Needs assistance   Gait / Transfers Assistance Needed: stand-pivot with 2 person assist (per son); when only one person available, they use hoyer lift  ADL's / Homemaking Assistance Needed: Total care; able to self feed.         Hand Dominance   Dominant Hand: Right    Extremity/Trunk Assessment   Upper Extremity Assessment: Generalized weakness (AROM WFL)           Lower Extremity Assessment: RLE deficits/detail;LLE deficits/detail RLE Deficits / Details: AAROM hip to  90 (grimaces), knee to 90 (resists), ankle to neutral; strength 2+ to 3- LLE Deficits / Details: able to wiggle toes and assist with raising leg off bed; in long leg splint (padding with ace wrap)  Cervical / Trunk Assessment: Kyphotic  Communication   Communication: HOH  Cognition Arousal/Alertness: Lethargic (arouses, easily falls asleep without stimulation') Behavior During Therapy: WFL for tasks assessed/performed Overall Cognitive Status: History of cognitive impairments - at baseline                       General Comments General comments (skin integrity, edema, etc.): Family present and provided history    Exercises General Exercises - Lower Extremity Long Arc Quad: AROM;Right;5 reps Heel Slides: AAROM;Right;5 reps Low Level/ICU Exercises Ankle Circles/Pumps: AAROM;Right;5 reps Other Exercises Other Exercises: Seated trunk extension and "slouching"/posterior tilt; trunk rotation and sidebending all with minguard to min assist for balance and to return to midline   Assessment/Plan    PT Assessment All further PT needs can be met in the next venue of care  PT Problem List Decreased strength;Decreased range of motion;Decreased activity tolerance;Decreased balance;Decreased mobility;Decreased cognition;Decreased safety awareness;Decreased knowledge of precautions;Pain          PT Treatment Interventions      PT Goals (Current goals can be found in the Care Plan section)  Acute Rehab PT Goals Patient Stated Goal: return home  PT Goal Formulation: All assessment and education complete, DC therapy    Frequency     Barriers to discharge        Co-evaluation               End of Session Equipment Utilized During Treatment: Oxygen Activity Tolerance: Patient limited by fatigue Patient left: in bed;with call bell/phone within reach;with bed alarm set;with family/visitor present           Time: 1660-6004 PT Time Calculation (min) (ACUTE ONLY): 22 min   Charges:   PT Evaluation $PT Eval Low Complexity: 1 Procedure     PT G Codes:        [name] 10/28/15, 10:56 AM  Pager [phone]

## 2015-10-19 NOTE — Evaluation (Signed)
Clinical/Bedside Swallow Evaluation Patient Details  Name: SENA HOOPINGARNER MRN: [id number] Date of Birth: 15-Jul-1914  Today's Date: 10/19/2015 Time: SLP Start Time (ACUTE ONLY): 4098 SLP Stop Time (ACUTE ONLY): 0840 SLP Time Calculation (min) (ACUTE ONLY): 14 min  Past Medical History:  Past Medical History:  Diagnosis Date  . Anxiety   . Arthritis   . Dementia without behavioral disturbance   . Depression   . Osteoporosis   . Parkinson disease (HCC)   . Urinary incontinence    Past Surgical History:  Past Surgical History:  Procedure Laterality Date  . APPENDECTOMY    . COLONOSCOPY W/ POLYPECTOMY  ?1980's  . FEMUR IM NAIL Left 01/15/2015   Procedure: INTRAMEDULLARY (IM) NAIL FEMORAL;  Surgeon: Juanell Fairly, MD;  Location: [facility] ORS;  Service: Orthopedics;  Laterality: Left;  femur  . TONSILLECTOMY     HPI:  Karra Pink Killmeieris an 82 y.o.white femalewho is a long term resident at [facility] facility in [place]. Pt has dementia and parkinsons. She somehow fell out of the hoyer lift at the facility sustaining an injury to her L leg. Patient presented to the orthopedic office in [place]. She was admitted to the hospital with left proximal tibia fracture. Underwent surgery on 9/28. Swallow eval ordered post op due to numerous risk factors.   Assessment / Plan / Recommendation Clinical Impression  Pt demosntrates adequate alertness for PO intake, able to be repositioned, participated in oral care and followed commands for assessment. No signs of aspiration with thin liquids. Pt does have slow, laborious mastication due to missing dentition (two teeth, nibbles solids). Recommend initiating a dys 2 (ground) diet with potential to advance when pt seen with meal or family present to give more information about function. RN states pt takes meds crushed in puree, can continue this if pt typically takes pills this way.     Aspiration Risk  Mild aspiration risk    Diet  Recommendation Dysphagia 2 (Fine chop);Thin liquid   Liquid Administration via: Cup;Straw Medication Administration: Crushed with puree Supervision: Staff to assist with self feeding Compensations: Slow rate;Small sips/bites;Minimize environmental distractions Postural Changes: Seated upright at 90 degrees    Other  Recommendations Oral Care Recommendations: Oral care BID   Follow up Recommendations 24 hour supervision/assistance;Skilled Nursing facility      Frequency and Duration min 2x/week  2 weeks       Prognosis Prognosis for Safe Diet Advancement: Good      Swallow Study   General HPI: Comfort V Killmeieris an 82 y.o.white femalewho is a long term resident at [facility] facility in [place]. Pt has dementia and parkinsons. She somehow fell out of the hoyer lift at the facility sustaining an injury to her L leg. Patient presented to the orthopedic office in [place]. She was admitted to the hospital with left proximal tibia fracture. Underwent surgery on 9/28. Swallow eval ordered post op due to numerous risk factors. Type of Study: Bedside Swallow Evaluation Previous Swallow Assessment: none Diet Prior to this Study: NPO Temperature Spikes Noted: No Respiratory Status: Other (comment) (pt pulled off Gilbert, stayed at 90, SLP replaced) History of Recent Intubation: Yes Length of Intubations (days):  (for surgery) Date extubated: 10/18/15 Behavior/Cognition: Alert;Cooperative;Requires cueing Oral Cavity Assessment: Dry Oral Care Completed by SLP: Yes Oral Cavity - Dentition: Poor condition;Missing dentition (two teeth) Vision: Functional for self-feeding Self-Feeding Abilities: Needs assist Patient Positioning: Upright in bed Baseline Vocal Quality: Normal Volitional Cough: Strong Volitional Swallow: Able to  elicit    Oral/Motor/Sensory Function Overall Oral Motor/Sensory Function: Within functional limits   Ice Chips     Thin Liquid Thin Liquid: Within functional  limits Presentation: Cup;Straw;Self Fed    Nectar Thick Nectar Thick Liquid: Not tested   Honey Thick Honey Thick Liquid: Not tested   Puree Puree: Within functional limits   Solid   GO   Solid: Impaired Presentation: Self Fed Oral Phase Impairments: Impaired mastication (pt has two teeth, nibbles food) Oral Phase Functional Implications: Impaired mastication       Harlon DittyBonnie Sinda Leedom, MA CCC-SLP [phone]  Zaine Elsass, Riley NearingBonnie Caroline 10/19/2015,8:59 AM

## 2015-10-19 NOTE — Care Management Note (Signed)
Case Management Note  Patient Details  Name: Rachel Faulkner MRN: [id number] Date of Birth: 03-20-1914  Subjective/Objective:  S/p Left IM Tibial                  Action/Plan: Discharge Planning: AVS reviewed  Chart reviewed. Scheduled dc 10/20/2015 to SNF. CSW following for SNF placement.    Expected Discharge Date:  10/20/2015              Expected Discharge Plan:  Skilled Nursing Facility  In-House Referral:  Clinical Social Work  Discharge planning Services  CM Consult  Post Acute Care Choice:  NA Choice offered to:  NA  DME Arranged:  N/A DME Agency:  NA  HH Arranged:  NA HH Agency:  NA  Status of Service:  Completed, signed off  If discussed at [employer] Length of Stay Meetings, dates discussed:    Additional Comments:  Elliot CousinShavis, Gonsalo Cuthbertson Ellen, RN 10/19/2015, 2:13 PM

## 2015-10-19 NOTE — [place] Notes (Signed)
[facility] Note  Subjective  Pt doing wonderfully this am Looks great  Good night last night No issues of note   Review of Systems  Unable to perform ROS: Dementia     Objective   BP (!) 170/47 (BP Location: Left Arm)   Pulse 70   Temp 98 F (36.7 C) (Axillary)   Resp 16   SpO2 94%   Intake/Output      09/28 0701 - 09/29 0700 09/29 0701 - 09/30 0700   P.O.  0   I.V. 3475.7    IV Piggyback 50    Total Intake 3525.7 0   Blood 50    Total Output 50     Net +3475.7 0        Urine Occurrence 1 x 1 x   Stool Occurrence  1 x     Labs  Results for Denison, Kursten V (MRN [id number]) as of 10/19/2015 10:12  Ref. Range 10/19/2015 06:08  Sodium Latest Ref Range: 135 - 145 mmol/L 136  Potassium Latest Ref Range: 3.5 - 5.1 mmol/L 3.7  Chloride Latest Ref Range: 101 - 111 mmol/L 103  CO2 Latest Ref Range: 22 - 32 mmol/L 26  BUN Latest Ref Range: 6 - 20 mg/dL 9  Creatinine Latest Ref Range: 0.44 - 1.00 mg/dL 0.55  Calcium Latest Ref Range: 8.9 - 10.3 mg/dL 8.6 (L)  EGFR (Non-African Amer.) Latest Ref Range: >60 mL/min >60  EGFR (African American) Latest Ref Range: >60 mL/min >60  Glucose Latest Ref Range: 65 - 99 mg/dL 93  Anion gap Latest Ref Range: 5 - 15  7   Results for Thrush, Davetta V (MRN [id number]) as of 10/19/2015 10:12  Ref. Range 10/18/2015 11:22  Vitamin D, 25-Hydroxy Latest Ref Range: 30.0 - 100.0 ng/mL 35.7   Cbc pending  Exam  Gen: resting comfortably in bed, NAD, pleasant and conversing well Lungs: clear anterior fields Cardiac: s1 and s2, RRR Abd: + BS,NT Ext:       Left Lower Extremity   Dressing c/d/i  Ext warm  Moves toes  Sensation grossly intact   + DP pulse     Assessment and Plan   POD/HD#: 1  82 y/o female s/p fall out of a hoyer lift with L proximal tibia fracture    -fall from hoyer lift   - L proximal tibia fracture s/p IMN  NWB L leg   Pt nonambulatory but does stand to facilitate transfers. Ok to  WB on R leg  Unrestricted L knee ROM  Chronic L knee flexion contracture noted in OR  Plan for dc to snf tomorrow    Will keep pt in current dressing until office follow up    See in office in 5 days                - Pain management:             tylenol 500-1000 mg po q6h prn  norco 5/325 1 po q8h prn severe pain    - ABL anemia/Hemodynamics             cbc pending  Labs look good                Systolic HTN and Wide pulse pressure                         Widened pulse pressure likely related to age                           Pt w/o hx of HTN                                     Likely related to pain                          Will monitor - no acute changes noted    - Medical issues              Dementia/parkinsons                         Home meds   - DVT/PE prophylaxis:             lovenox post op and for 21 days- on 30 mg sq daily due to CrCl    - ID:              periop ancef   - Metabolic Bone Disease:             Pt with hx of osteoporosis, further exacerbated by limited activity and really no WB activity              Pt on Vitamin D monthly                Vitamin d look good    - Activity:             PT/OT   - FEN/GI prophylaxis/Foley/Lines:             appreciate SLP eval  Dysphagia 2 diet with thin liquids   NSL IV   - Impediments to fracture healing:             Severe osteoporosis              Dementia    - CODE STATUS: DO NOT RESUSCITATE   - Dispo:             SNF tomorrow       W. , PA-C [facility] [phone] ([phone] (O) 10/19/2015 10:11 AM  

## 2015-10-19 NOTE — [place] Notes (Signed)
Initial Nutrition Assessment  DOCUMENTATION CODES:   Not applicable  INTERVENTION:   -Ensure Enlive po daily, each supplement provides 350 kcal and 20 grams of protein -Continue MVI daily  NUTRITION DIAGNOSIS:   Increased nutrient needs related to  (post-op healing) as evidenced by estimated needs.  GOAL:   Patient will meet greater than or equal to 90% of their needs  MONITOR:   PO intake, Supplement acceptance, Diet advancement, Labs, Weight trends, Skin, I & O's  REASON FOR ASSESSMENT:   Low Braden    ASSESSMENT:   Rachel Faulkner is an 82 y.o. white female who is a long term resident at  [facility] facility in [place] . Pt has dementia and parkinsons. She somehow fell out of the hoyer lift at the facility sustaining an injury to her L leg. Patient presented to the [place]opedic office in [place]. She was admitted to the hospital with left proximal tibia fracture. Hospitalist was consulted for management of medical issues as well. Due to the complexity of the injury orthopedic trauma service was contacted regarding this course of treatment. Patient was subsequently transferred to [place] yesterday evening for definitive management.  S/p Procedure(s) (LRB): INTRAMEDULLARY (IM) NAIL TIBIAL (Left)  Pt admitted with lt proximal hip fx.   Spoke with pt's sons at bedside. Both reports pt typically has a fair appetite and receives a regular diet (no restrictions) at [facility]. They estimate pt generally consumes about 50% of her meals. Pt does not have teeth, however, does not have difficulty chewing. Pt sons report pt consumes [employer] supplement at [facility].   Pt underwent BSE this AM; pt was advanced to a dysphagia 2 diet with thin liquids, related to slow mastication related to missing dentition. Pt sons are aware and agree with SLP recommendation. They also request a supplement to be consistent with pt home regimen.   Noted hx of mild wt loss, however, wt hx not  significant for time frame.   Nutrition-Focused physical exam completed. Findings are mild fat depletion, mild muscle depletion, and mild edema. Pt's sons report pt has been wheelchair bound for several years; suspect fat and muscle depletion is related to advanced age and limited mobility.   Case discussed with RN, who reports pt is taking water and medications in applesauce well. Plan to d.c back to [facility] tomorrow, 10/20/15.   Labs reviewed.  Diet Order:  DIET DYS 2 Room service appropriate? Yes; Fluid consistency: Thin Diet general  Skin:  Reviewed, no issues  Last BM:  10/18/15  Height:   Ht Readings from Last 1 Encounters:  10/16/15 5' (1.524 m)    Weight:   Wt Readings from Last 1 Encounters:  10/16/15 120 lb (54.4 kg)    Ideal Body Weight:  45.5 kg  BMI:  Estimated body mass index is 23.44 kg/m as calculated from the following:   Height as of 10/16/15: 5' (1.524 m).   Weight as of 10/16/15: 120 lb (54.4 kg).  Estimated Nutritional Needs:   Kcal:  1300-1500  Protein:  60-70 grams  Fluid:  1.3-1.5 L  EDUCATION NEEDS:   Education needs addressed  Jermon Chalfant A. Mayford KnifeWilliams, RD, LDN, CDE Pager: [phone] After hours Pager: [phone]

## 2015-10-19 NOTE — Discharge Summary (Signed)
Orthopaedic Trauma Service (OTS)  Patient ID: Rachel Faulkner MRN: [id number] DOB/AGE: Nov 23, 1914 82 y.o.  Admit date: 10/17/2015 Discharge date: 10/20/2015  Admission Diagnoses: Left proximal tibia fracutre Osteoporosis Dementia Parkinson's  Anxiety depression  Discharge Diagnoses:  Principal Problem:   Closed fracture of left proximal tibia Active Problems:   Osteoporosis   Urinary incontinence   Parkinson disease ([place])   Dementia without behavioral disturbance   Depression   Anxiety   Procedures Performed: 10/18/2015- Dr. Marcelino Scot  1. IMN Left tibia    Discharged Condition: stable  Hospital Course:      82 y/o female transferred from [facility] for tx of L proximal tibia fx. Family opted for surgical management. Pt underwent surgery on 10/18/2015. Tolerated procedure well. No periop issues of note. ST eval for dysphagia 2 diet. Stable for dc on POD 2 for return back to [facility] where she is a long term resident   Consults: None  Significant Diagnostic Studies: labs:   Results for Rachel, Faulkner (MRN [id number]) as of 10/20/2015 11:15  Ref. Range 10/20/2015 05:42  Sodium Latest Ref Range: 135 - 145 mmol/L 136  Potassium Latest Ref Range: 3.5 - 5.1 mmol/L 3.3 (L)  Chloride Latest Ref Range: 101 - 111 mmol/L 100 (L)  CO2 Latest Ref Range: 22 - 32 mmol/L 29  BUN Latest Ref Range: 6 - 20 mg/dL 7  Creatinine Latest Ref Range: 0.44 - 1.00 mg/dL 0.54  Calcium Latest Ref Range: 8.9 - 10.3 mg/dL 8.5 (L)  EGFR (Non-African Amer.) Latest Ref Range: >60 mL/min >60  EGFR (African American) Latest Ref Range: >60 mL/min >60  Glucose Latest Ref Range: 65 - 99 mg/dL 110 (H)  Anion gap Latest Ref Range: 5 - 15  7  WBC Latest Ref Range: 4.0 - 10.5 K/uL 8.1  RBC Latest Ref Range: 3.87 - 5.11 MIL/uL 3.23 (L)  Hemoglobin Latest Ref Range: 12.0 - 15.0 g/dL 9.5 (L)  HCT Latest Ref Range: 36.0 - 46.0 % 30.3 (L)  MCV Latest Ref Range: 78.0 - 100.0 fL 93.8  MCH Latest Ref Range: 26.0 -  34.0 pg 29.4  MCHC Latest Ref Range: 30.0 - 36.0 g/dL 31.4  RDW Latest Ref Range: 11.5 - 15.5 % 12.3  Platelets Latest Ref Range: 150 - 400 K/uL 349  Results for Rachel, Faulkner (MRN [id number]) as of 10/20/2015 11:15  Ref. Range 10/18/2015 11:22  Vit D, 1,25-Dihydroxy Latest Ref Range: 19.9 - 79.3 pg/mL 73.0  Vitamin D, 25-Hydroxy Latest Ref Range: 30.0 - 100.0 ng/mL 35.7    Treatments: IV hydration, antibiotics: Ancef, analgesia: acetaminophen and norco, anticoagulation: LMW heparin, therapies: PT, OT, RN and SW and surgery: as above   Disposition:SNF  Discharge Instructions    Call MD / Call 911    Complete by:  As directed    If you experience chest pain or shortness of breath, CALL 911 and be transported to the hospital emergency room.  If you develope a fever above 101 F, pus (white drainage) or increased drainage or redness at the wound, or calf pain, call your surgeon's office.   Constipation Prevention    Complete by:  As directed    Drink plenty of fluids.  Prune juice may be helpful.  You may use a stool softener, such as Colace (over the counter) 100 mg twice a day.  Use MiraLax (over the counter) for constipation as needed.   Diet general    Complete by:  As directed    Dysphagia  2 diet with thin liquids   Discharge instructions    Complete by:  As directed    Orthopaedic Trauma Service Discharge Instructions   General Discharge Instructions  WEIGHT BEARING STATUS: nonweightbearing left leg  RANGE OF MOTION/ACTIVITY: left knee range of motion as tolerated  Wound Care: no wound care or dressing changes at this time. Will remove current dressing at first office visit. If dressing gets soiled please call office at [phone]  PAIN MEDICATION USE AND EXPECTATIONS  You have likely been given narcotic medications to help control your pain.  After a traumatic event that results in an fracture (broken bone) with or without surgery, it is ok to use narcotic pain medications  to help control one's pain.  We understand that everyone responds to pain differently and each individual patient will be evaluated on a regular basis for the continued need for narcotic medications. Ideally, narcotic medication use should last no more than 6-8 weeks (coinciding with fracture healing).   As a patient it is your responsibility as well to monitor narcotic medication use and report the amount and frequency you use these medications when you come to your office visit.   We would also advise that if you are using narcotic medications, you should take a dose prior to therapy to maximize you participation.  IF YOU ARE ON NARCOTIC MEDICATIONS IT IS NOT PERMISSIBLE TO OPERATE A MOTOR VEHICLE (MOTORCYCLE/CAR/TRUCK/MOPED) OR HEAVY MACHINERY DO NOT MIX NARCOTICS WITH OTHER CNS (CENTRAL NERVOUS SYSTEM) DEPRESSANTS SUCH AS ALCOHOL  Diet: as you were eating previously.  Can use over the counter stool softeners and bowel preparations, such as Miralax, to help with bowel movements.  Narcotics can be constipating.  Be sure to drink plenty of fluids    STOP SMOKING OR USING NICOTINE PRODUCTS!!!!  As discussed nicotine severely impairs your body's ability to heal surgical and traumatic wounds but also impairs bone healing.  Wounds and bone heal by forming microscopic blood vessels (angiogenesis) and nicotine is a vasoconstrictor (essentially, shrinks blood vessels).  Therefore, if vasoconstriction occurs to these microscopic blood vessels they essentially disappear and are unable to deliver necessary nutrients to the healing tissue.  This is one modifiable factor that you can do to dramatically increase your chances of healing your injury.    (This means no smoking, no nicotine gum, patches, etc)  DO NOT USE NONSTEROIDAL ANTI-INFLAMMATORY DRUGS (NSAID'S)  Using products such as Advil (ibuprofen), Aleve (naproxen), Motrin (ibuprofen) for additional pain control during fracture healing can delay and/or  prevent the healing response.  If you would like to take over the counter (OTC) medication, Tylenol (acetaminophen) is ok.  However, some narcotic medications that are given for pain control contain acetaminophen as well. Therefore, you should not exceed more than 4000 mg of tylenol in a day if you do not have liver disease.  Also note that there are may OTC medicines, such as cold medicines and allergy medicines that my contain tylenol as well.  If you have any questions about medications and/or interactions please ask your doctor/PA or your pharmacist.      ICE AND ELEVATE INJURED/OPERATIVE EXTREMITY  Using ice and elevating the injured extremity above your heart can help with swelling and pain control.  Icing in a pulsatile fashion, such as 20 minutes on and 20 minutes off, can be followed.    Do not place ice directly on skin. Make sure there is a barrier between to skin and the ice pack.  Using frozen items such as frozen peas works well as the conform nicely to the are that needs to be iced.  USE AN ACE WRAP OR TED HOSE FOR SWELLING CONTROL  In addition to icing and elevation, Ace wraps or TED hose are used to help limit and resolve swelling.  It is recommended to use Ace wraps or TED hose until you are informed to stop.    When using Ace Wraps start the wrapping distally (farthest away from the body) and wrap proximally (closer to the body)   Example: If you had surgery on your leg or thing and you do not have a splint on, start the ace wrap at the toes and work your way up to the thigh        If you had surgery on your upper extremity and do not have a splint on, start the ace wrap at your fingers and work your way up to the upper arm  IF YOU ARE IN A SPLINT OR CAST DO NOT [place]   If your splint gets wet for any reason please contact the office immediately. You may shower in your splint or cast as long as you keep it dry.  This can be done by wrapping in a cast cover or  garbage back (or similar)  Do Not stick any thing down your splint or cast such as pencils, money, or hangers to try and scratch yourself with.  If you feel itchy take benadryl as prescribed on the bottle for itching  IF YOU ARE IN A CAM BOOT (BLACK BOOT)  You may remove boot periodically. Perform daily dressing changes as noted below.  Wash the liner of the boot regularly and wear a sock when wearing the boot. It is recommended that you sleep in the boot until told otherwise  CALL THE OFFICE WITH ANY QUESTIONS OR CONCERNS: [phone]   Do not put a pillow under the knee. Place it under the heel.    Complete by:  As directed    Increase activity slowly as tolerated    Complete by:  As directed    Non weight bearing    Complete by:  As directed    Laterality:  left   Extremity:  Lower       Medication List    TAKE these medications   acetaminophen 500 MG tablet Commonly known as:  TYLENOL Take 1-2 tablets (500-1,000 mg total) by mouth every 6 (six) hours as needed for mild pain (or Fever >/= 101). What changed:  medication strength  how much to take  when to take this  reasons to take this  Another medication with the same name was removed. Continue taking this medication, and follow the directions you see here.   albuterol 108 (90 Base) MCG/ACT inhaler Commonly known as:  PROVENTIL HFA;VENTOLIN HFA Inhale 2 puffs into the lungs every 4 (four) hours as needed for wheezing or shortness of breath.   alum & mag hydroxide-simeth 200-200-20 MG/5ML suspension Commonly known as:  MAALOX/MYLANTA Take 30 mLs by mouth every 4 (four) hours as needed for indigestion, heartburn or flatulence.   aspirin 81 MG chewable tablet Chew 81 mg by mouth daily.   bisacodyl 10 MG suppository Commonly known as:  DULCOLAX Place 1 suppository (10 mg total) rectally daily as needed for moderate constipation.   carbidopa-levodopa 25-100 MG tablet Commonly known as:  SINEMET IR Take 1 tablet  by mouth 4 (four) times daily.  enoxaparin 30 MG/0.3ML injection Commonly known as:  LOVENOX Inject 0.3 mLs (30 mg total) into the skin daily.   feeding supplement (ENSURE ENLIVE) Liqd Take 237 mLs by mouth daily.   furosemide 20 MG tablet Commonly known as:  LASIX Take 20 mg by mouth daily.   HYDROcodone-acetaminophen 5-325 MG tablet Commonly known as:  NORCO/VICODIN Take 1 tablet by mouth every 8 (eight) hours as needed (pain). What changed:  when to take this   loratadine 10 MG tablet Commonly known as:  CLARITIN Take 10 mg by mouth daily.   multivitamin with minerals Tabs tablet Take 1 tablet by mouth at bedtime.   ondansetron 4 MG tablet Commonly known as:  ZOFRAN Take 1 tablet (4 mg total) by mouth every 6 (six) hours as needed for nausea.   polyethylene glycol packet Commonly known as:  MIRALAX / GLYCOLAX Take 17 g by mouth daily as needed for mild constipation.   pramipexole 0.25 MG tablet Commonly known as:  MIRAPEX Take 0.25 mg by mouth 3 (three) times daily.   PRESERVISION AREDS 2 PO Take 1 tablet by mouth daily at 12 noon.   sertraline 25 MG tablet Commonly known as:  ZOLOFT Take 25 mg by mouth at bedtime.   Vitamin D (Ergocalciferol) 50000 units Caps capsule Commonly known as:  DRISDOL Take 50,000 Units by mouth every 30 (thirty) days. Pt takes on the 24th of every month.      Follow-up Information    Rozanna Box, MD. Schedule an appointment as soon as possible for a visit on 10/24/2015.   Specialty:  Orthopedic Surgery Why:  wound check and dressing change  Contact information: [place] [address] [phone]           Discharge Instructions and Plan:  82 y/o female s/p fall out of a hoyer lift with L proximal tibia fracture    -fall from hoyer lift   - L proximal tibia fracture s/p IMN             NWB L leg                         Pt nonambulatory but does stand to facilitate transfers. Ok to  [employer] on R leg             Unrestricted L knee ROM             Chronic L knee flexion contracture noted in OR                  Will keep pt in current dressing until office follow up                          See in office in 5 days     - Pain management:             tylenol [phone] mg po q6h prn             norco 5/325 1 po q8h prn severe pain    - ABL anemia/Hemodynamics                Systolic HTN and Wide pulse pressure                         Widened pulse pressure likely related to age  Pt w/o hx of HTN                                     Likely related to pain                          Will monitor - no acute changes noted    - Medical issues              Dementia/parkinsons                         Home meds   - DVT/PE prophylaxis:             lovenox post op and for 21 days- on 30 mg sq daily due to CrCl    - ID:              periop ancef completed   - Metabolic Bone Disease:             Pt with hx of osteoporosis, further exacerbated by limited activity and really no WB activity              Pt on Vitamin D monthly                Vitamin d look good    - Activity:             PT/OT    - FEN/GI prophylaxis/Foley/Lines:                        Dysphagia 2 diet with thin liquids       - Impediments to fracture healing:             Severe osteoporosis              Dementia    - CODE STATUS: DO NOT RESUSCITATE   - Dispo:             SNF  Follow up with ortho in 5 days. Please call for appointment   Signed:  Jari Pigg, PA-C [facility] [phone] (P) 10/20/2015, 11:18 AM

## 2015-10-19 NOTE — Discharge Instructions (Signed)
Orthopaedic Trauma Service Discharge Instructions   General Discharge Instructions  WEIGHT BEARING STATUS: nonweightbearing left leg  RANGE OF MOTION/ACTIVITY: left knee range of motion as tolerated  Wound Care: no wound care or dressing changes at this time. Will remove current dressing at first office visit. If dressing gets soiled please call office at [phone]  PAIN MEDICATION USE AND EXPECTATIONS  You have likely been given narcotic medications to help control your pain.  After a traumatic event that results in an fracture (broken bone) with or without surgery, it is ok to use narcotic pain medications to help control one's pain.  We understand that everyone responds to pain differently and each individual patient will be evaluated on a regular basis for the continued need for narcotic medications. Ideally, narcotic medication use should last no more than 6-8 weeks (coinciding with fracture healing).   As a patient it is your responsibility as well to monitor narcotic medication use and report the amount and frequency you use these medications when you come to your office visit.   We would also advise that if you are using narcotic medications, you should take a dose prior to therapy to maximize you participation.  IF YOU ARE ON NARCOTIC MEDICATIONS IT IS NOT PERMISSIBLE TO OPERATE A MOTOR VEHICLE (MOTORCYCLE/CAR/TRUCK/MOPED) OR HEAVY MACHINERY DO NOT MIX NARCOTICS WITH OTHER CNS (CENTRAL NERVOUS SYSTEM) DEPRESSANTS SUCH AS ALCOHOL  Diet: as you were eating previously.  Can use over the counter stool softeners and bowel preparations, such as Miralax, to help with bowel movements.  Narcotics can be constipating.  Be sure to drink plenty of fluids    STOP SMOKING OR USING NICOTINE PRODUCTS!!!!  As discussed nicotine severely impairs your body's ability to heal surgical and traumatic wounds but also impairs bone healing.  Wounds and bone heal by forming microscopic blood vessels  (angiogenesis) and nicotine is a vasoconstrictor (essentially, shrinks blood vessels).  Therefore, if vasoconstriction occurs to these microscopic blood vessels they essentially disappear and are unable to deliver necessary nutrients to the healing tissue.  This is one modifiable factor that you can do to dramatically increase your chances of healing your injury.    (This means no smoking, no nicotine gum, patches, etc)  DO NOT USE NONSTEROIDAL ANTI-INFLAMMATORY DRUGS (NSAID'S)  Using products such as Advil (ibuprofen), Aleve (naproxen), Motrin (ibuprofen) for additional pain control during fracture healing can delay and/or prevent the healing response.  If you would like to take over the counter (OTC) medication, Tylenol (acetaminophen) is ok.  However, some narcotic medications that are given for pain control contain acetaminophen as well. Therefore, you should not exceed more than 4000 mg of tylenol in a day if you do not have liver disease.  Also note that there are may OTC medicines, such as cold medicines and allergy medicines that my contain tylenol as well.  If you have any questions about medications and/or interactions please ask your doctor/PA or your pharmacist.      ICE AND ELEVATE INJURED/OPERATIVE EXTREMITY  Using ice and elevating the injured extremity above your heart can help with swelling and pain control.  Icing in a pulsatile fashion, such as 20 minutes on and 20 minutes off, can be followed.    Do not place ice directly on skin. Make sure there is a barrier between to skin and the ice pack.    Using frozen items such as frozen peas works well as the conform nicely to the are that needs to be iced.  USE AN ACE WRAP OR TED HOSE FOR SWELLING CONTROL  In addition to icing and elevation, Ace wraps or TED hose are used to help limit and resolve swelling.  It is recommended to use Ace wraps or TED hose until you are informed to stop.    When using Ace Wraps start the wrapping distally  (farthest away from the body) and wrap proximally (closer to the body)   Example: If you had surgery on your leg or thing and you do not have a splint on, start the ace wrap at the toes and work your way up to the thigh        If you had surgery on your upper extremity and do not have a splint on, start the ace wrap at your fingers and work your way up to the upper arm  IF YOU ARE IN A SPLINT OR CAST DO NOT REMOVE IT FOR ANY REASON   If your splint gets wet for any reason please contact the office immediately. You may shower in your splint or cast as long as you keep it dry.  This can be done by wrapping in a cast cover or garbage back (or similar)  Do Not stick any thing down your splint or cast such as pencils, money, or hangers to try and scratch yourself with.  If you feel itchy take benadryl as prescribed on the bottle for itching  IF YOU ARE IN A CAM BOOT (BLACK BOOT)  You may remove boot periodically. Perform daily dressing changes as noted below.  Wash the liner of the boot regularly and wear a sock when wearing the boot. It is recommended that you sleep in the boot until told otherwise  CALL THE OFFICE WITH ANY QUESTIONS OR CONCERNS: [phone]

## 2015-10-19 NOTE — Progress Notes (Signed)
Pt is incontinence of both urine and BM, with try to obtain specimen,  Rachel Faulkner, alert and orientated x2 today and very pleasant

## 2015-10-19 NOTE — Progress Notes (Signed)
OT Cancellation Note  Patient Details Name: Rachel Faulkner MRN: [id number] DOB: September 07, 1914   Cancelled Treatment:    Reason Eval/Treat Not Completed: Other (comment) (Pt from SNF with plan to return to SNF upon d/c; will defer OT to next venue)  [name] M.S., OTR/L Pager: [phone]  10/19/2015, 4:33 PM

## 2015-10-20 LAB — BASIC METABOLIC PANEL
Anion gap: 7 (ref 5–15)
BUN: 7 mg/dL (ref 6–20)
CHLORIDE: 100 mmol/L — AB (ref 101–111)
CO2: 29 mmol/L (ref 22–32)
CREATININE: 0.54 mg/dL (ref 0.44–1.00)
Calcium: 8.5 mg/dL — ABNORMAL LOW (ref 8.9–10.3)
GFR calc Af Amer: >60 mL/min (ref >60)
GFR calc non Af Amer: >60 mL/min (ref >60)
Glucose, Bld: 110 mg/dL — ABNORMAL HIGH (ref 65–99)
POTASSIUM: 3.3 mmol/L — AB (ref 3.5–5.1)
SODIUM: 136 mmol/L (ref 135–145)

## 2015-10-20 LAB — CBC
HEMATOCRIT: 30.3 % — AB (ref 36.0–46.0)
HEMOGLOBIN: 9.5 g/dL — AB (ref 12.0–15.0)
MCH: 29.4 pg (ref 26.0–34.0)
MCHC: 31.4 g/dL (ref 30.0–36.0)
MCV: 93.8 fL (ref 78.0–100.0)
Platelets: 349 10*3/uL (ref 150–400)
RBC: 3.23 MIL/uL — AB (ref 3.87–5.11)
RDW: 12.3 % (ref 11.5–15.5)
WBC: 8.1 10*3/uL (ref 4.0–10.5)

## 2015-10-20 MED ORDER — ENSURE ENLIVE PO LIQD: 237.0000 mL | ORAL | 12 refills | Status: AC

## 2015-10-20 NOTE — Progress Notes (Signed)
Initial report called to Judeth CornfieldStephanie, Charity fundraiserN at [facility] at [phone].  Follow-up report called to Suzie, RN at [facility] at 1630 to provide latest vital signs, update on prescription medications, last pain medication given and estimated time of arrival.    Family (son, daughter-in-law) present when [facility] arrived, they indicated that they would meet patient at the facility.  No complaints of pain at this time.  PTAR had all of the necessary paperwork.

## 2015-10-20 NOTE — Progress Notes (Signed)
Subjective: 2 Days Post-Op Procedure(s) (LRB): INTRAMEDULLARY (IM) NAIL TIBIAL (Left) Patient sitting up in bed eating her breakfast, pleasantly confused which I believe to be her baseline,  No c/o pain or discomfort  Objective: Vital signs in last 24 hours: Temp:  [98.8 F (37.1 C)-99.3 F (37.4 C)] 98.8 F (37.1 C) (09/30 0456) Pulse Rate:  [69-76] 70 (09/30 0456) Resp:  [16] 16 (09/30 0456) BP: (140-154)/(57-68) 153/68 (09/30 0456) SpO2:  [92 %-100 %] 92 % (09/30 0456)  Intake/Output from previous day: 09/29 0701 - 09/30 0700 In: 620 [P.O.:240; I.V.:330; IV Piggyback:50] Out: -  Intake/Output this shift: No intake/output data recorded.   Recent Labs  10/17/15 2027 10/18/15 1122 10/19/15 1431 10/20/15 0542  HGB 10.3* 10.1* 9.1* 9.5*    Recent Labs  10/19/15 1431 10/20/15 0542  WBC 9.4 8.1  RBC 3.05* 3.23*  HCT 29.1* 30.3*  PLT 335 349    Recent Labs  10/19/15 0608 10/20/15 0542  NA 136 136  K 3.7 3.3*  CL 103 100*  CO2 26 29  BUN 9 7  CREATININE 0.55 0.54  GLUCOSE 93 110*  CALCIUM 8.6* 8.5*   No results for input(s): LABPT, INR in the last 72 hours.  Neurovascular intact Incision: dressing C/D/I  Assessment/Plan: 2 Days Post-Op Procedure(s) (LRB): INTRAMEDULLARY (IM) NAIL TIBIAL (Left) [age] y/o female s/p fall out of a hoyer lift with L proximal tibia fracture   -fall from hoyer lift  - L proximal tibia fracture s/p IMN NWB L leg Pt nonambulatory but does stand to facilitate transfers. Ok to [employer] on R leg Unrestricted L knee ROM Chronic L knee flexion contracture noted in OR  Will keep pt in current dressing until office follow up  See in office in 5 days  - Pain management: tylenol [phone] mg po q6h prn norco 5/325 1 po q8h prn severe pain   - DVT/PE prophylaxis: lovenox post op and for 21 days- on 30  mg sq daily due to CrCl   - Metabolic Bone Disease: Pt with hx of osteoporosis, further exacerbated by limited activity and really no WB activity  Pt on Vitamin D monthly   Vitamin d look good   - Activity: PT/OT   - Impediments to fracture healing: Severe osteoporosis  Dementia   - CODE STATUS: DO NOT RESUSCITATE  - Dispo: SNF             Follow up with ortho in 5 days. Please call for appointment  Margart SicklesChadwell, Daylan Boggess 10/20/2015, 0730

## 2015-10-20 NOTE — Progress Notes (Signed)
Patient will DC to: [facility] Anticipated DC date: 10/20/15 Family notified: Family at bedside Transport by: Sharin MonsPTAR   Per MD patient ready for DC to [facility]. RN, patient, patient's family, and facility notified of DC. Discharge Summary sent to facility. RN given number for report [phone] or [phone]). DC packet on chart. Ambulance transport requested for patient.   CSW signing off.  Cristobal GoldmannNadia Marjie Chea, [place]WA Clinical Social Worker [phone]

## 2015-10-20 NOTE — Progress Notes (Signed)
Per Child psychotherapistocial Worker request, on call provider notified signature needed for the DNR form prior to transport to facility today.

## 2015-10-22 DIAGNOSIS — S82209S Unspecified fracture of shaft of unspecified tibia, sequela: Secondary | ICD-10-CM | POA: Diagnosis not present

## 2015-11-13 NOTE — H&P (Signed)
HPI: Rachel Faulkner is an 82 y.o. white female who is a long term resident at [facility] facility in [employer]. Pt has dementia and parkinsons. She somehow fell out of the hoyer lift at the facility sustaining an injury to her L leg. Patient presented to the orthopedic office in [place]. She was admitted to the hospital with left proximal tibia fracture. Hospitalist was consulted for management of medical issues as well. Due to the complexity of the injury orthopedic trauma service was contacted regarding this course of treatment. Patient was subsequently transferred to [place] yesterday evening for definitive management.             Patient's son and daughter-in-law are at bedside and they provide a lot of information regarding the patient's health and activity level. She is essentially nonambulatory. She has been this way for about 5 years after sustaining a right hip fracture which was operated on. She did sustained bilateral femur fractures a year ago. On the left was treated surgically by Dr. Mack Guise. Family indicates that she really is bed to chair. She does sometimes bear weight on her lower extremities to assist with transfers but is wheelchair dependent. Patient's really only medical issues are dementia, osteoporosis and arthritis. She does have some urinary incontinence as well. She is a full-time resident at [facility]              Patient was deemed low to moderate risk for surgery by the hospital at [facility]. EKG did not show any acute ST or T-wave changes.  Discussed CODE STATUS with the son: Patient is a DO NOT RESUSCITATE      Past Medical History:  Diagnosis Date  . Anxiety   . Arthritis   . Dementia without behavioral disturbance   . Depression   . Osteoporosis   . Parkinson disease ([place])   . Urinary incontinence          Past Surgical History:  Procedure Laterality Date  . APPENDECTOMY    . COLONOSCOPY W/ POLYPECTOMY  ?1980's  . FEMUR  IM NAIL Left 01/15/2015   Procedure: INTRAMEDULLARY (IM) NAIL FEMORAL;  Surgeon: Thornton Park, MD;  Location: [facility] ORS;  Service: Orthopedics;  Laterality: Left;  femur  . TONSILLECTOMY      Family History  Problem Relation Age of Onset  . Diabetes Neg Hx   . Hypertension Neg Hx   . Breast cancer Neg Hx   . Colon cancer Neg Hx     Social History:  reports that she has quit smoking. She quit after 2.00 years of use. She has quit using smokeless tobacco. She reports that she does not drink alcohol or use drugs.  Allergies:       Allergies  Allergen Reactions  . Codeine Other (See Comments)    Reaction:  Unknown   . Sulfa Antibiotics Other (See Comments)    Reaction:  Unknown     Medications:  I have reviewed the patient's current medications. Prior to Admission:         Prescriptions Prior to Admission  Medication Sig Dispense Refill Last Dose  . acetaminophen (TYLENOL ARTHRITIS PAIN) 650 MG CR tablet Take 650 mg by mouth 2 (two) times daily.    Past Week at unknown  . acetaminophen (TYLENOL) 325 MG tablet Take 650 mg by mouth 3 (three) times daily as needed for mild pain.   Past Week at Unknown time  . albuterol (PROVENTIL HFA;VENTOLIN HFA) 108 (90 BASE) MCG/ACT  inhaler Inhale 2 puffs into the lungs every 4 (four) hours as needed for wheezing or shortness of breath.   Past Week at Unknown time  . alum & mag hydroxide-simeth (MAALOX/MYLANTA) 200-200-20 MG/5ML suspension Take 30 mLs by mouth every 4 (four) hours as needed for indigestion, heartburn or flatulence.   unknown at unknown  . aspirin 81 MG chewable tablet Chew 81 mg by mouth daily.   10/16/2015 at am  . bisacodyl (DULCOLAX) 10 MG suppository Place 1 suppository (10 mg total) rectally daily as needed for moderate constipation. 12 suppository 0 prn at prn  . carbidopa-levodopa (SINEMET) 25-100 MG per tablet Take 1 tablet by mouth 4 (four) times daily.     10/16/2015 at 1200  . furosemide  (LASIX) 20 MG tablet Take 20 mg by mouth daily.    10/16/2015 at am  . HYDROcodone-acetaminophen (NORCO/VICODIN) 5-325 MG per tablet Take 1 tablet by mouth every 4 (four) hours as needed (pain). (Patient taking differently: Take 1 tablet by mouth every 4 (four) hours as needed for moderate pain. ) 30 tablet  prn at prn  . loratadine (CLARITIN) 10 MG tablet Take 10 mg by mouth daily.   10/16/2015 at am  . Multiple Vitamin (MULTIVITAMIN WITH MINERALS) TABS tablet Take 1 tablet by mouth at bedtime.   Past Week at pm  . Multiple Vitamins-Minerals (PRESERVISION AREDS 2 PO) Take 1 tablet by mouth daily at 12 noon.   10/16/2015 at pm  . polyethylene glycol (MIRALAX / GLYCOLAX) packet Take 17 g by mouth daily as needed for mild constipation. 14 each 0 prn at prn  . pramipexole (MIRAPEX) 0.25 MG tablet Take 0.25 mg by mouth 3 (three) times daily.     10/16/2015 at am  . sertraline (ZOLOFT) 25 MG tablet Take 25 mg by mouth at bedtime.    Past Week at pm  . Vitamin D, Ergocalciferol, (DRISDOL) 50000 UNITS CAPS capsule Take 50,000 Units by mouth every 30 (thirty) days. Pt takes on the 24th of every month.   Past Month at am    Lab Results Last 48 Hours        Results for orders placed or performed during the hospital encounter of 10/17/15 (from the past 48 hour(s))  Glucose, capillary     Status: Abnormal   Collection Time: 10/17/15  5:40 PM  Result Value Ref Range   Glucose-Capillary 109 (H) 65 - 99 mg/dL  CBC     Status: Abnormal   Collection Time: 10/17/15  8:27 PM  Result Value Ref Range   WBC 8.5 4.0 - 10.5 K/uL   RBC 3.41 (L) 3.87 - 5.11 MIL/uL   Hemoglobin 10.3 (L) 12.0 - 15.0 g/dL   HCT 32.8 (L) 36.0 - 46.0 %   MCV 96.2 78.0 - 100.0 fL   MCH 30.2 26.0 - 34.0 pg   MCHC 31.4 30.0 - 36.0 g/dL   RDW 12.8 11.5 - 15.5 %   Platelets 357 150 - 400 K/uL  Creatinine, serum     Status: None   Collection Time: 10/17/15  8:27 PM  Result Value Ref Range   Creatinine, Ser  0.54 0.44 - 1.00 mg/dL   GFR calc non Af Amer >60 >60 mL/min   GFR calc Af Amer >60 >60 mL/min    Comment: (NOTE) The eGFR has been calculated using the CKD EPI equation. This calculation has not been validated in all clinical situations. eGFR's persistently <60 mL/min signify possible Chronic Kidney Disease.  Results for KOLBEE, STALLMAN (MRN [id number]) as of 10/18/2015 09:58  Ref. Range 10/16/2015 14:13  Prothrombin Time Latest Ref Range: 11.4 - 15.2 seconds 13.1  INR Unknown 0.99  APTT Latest Ref Range: 24 - 36 seconds 24   Results for CARLOTTA, TELFAIR (MRN [id number]) as of 10/18/2015 09:58  Ref. Range 10/16/2015 14:13  Sample Expiration Unknown 10/19/2015  Antibody Screen Unknown NEG  ABO/RH(D) Unknown A NEG  Results for PRECIOSA, BUNDRICK (MRN [id number]) as of 10/18/2015 09:58  Ref. Range 10/17/2015 04:49  Sodium Latest Ref Range: 135 - 145 mmol/L 140  Potassium Latest Ref Range: 3.5 - 5.1 mmol/L 4.0  Chloride Latest Ref Range: 101 - 111 mmol/L 107  CO2 Latest Ref Range: 22 - 32 mmol/L 24  BUN Latest Ref Range: 6 - 20 mg/dL 23 (H)  Creatinine Latest Ref Range: 0.44 - 1.00 mg/dL 0.49  Calcium Latest Ref Range: 8.9 - 10.3 mg/dL 8.7 (L)  EGFR (Non-African Amer.) Latest Ref Range: >60 mL/min >60  EGFR (African American) Latest Ref Range: >60 mL/min >60  Glucose Latest Ref Range: 65 - 99 mg/dL 116 (H)  Anion gap Latest Ref Range: 5 - 15  9    Imaging Results (Last 48 hours)  Dg Chest Port 1 View  Result Date: 10/17/2015 CLINICAL DATA:  82 year old who may have fallen at the nursing home earlier today. Right tibial fracture. Preoperative respiratory evaluation. EXAM: PORTABLE CHEST 1 VIEW COMPARISON:  01/14/2015, 09/06/2013 and earlier. FINDINGS: Cardiac silhouette mildly enlarged for AP portable technique. Thoracic aorta atherosclerotic. Hilar and mediastinal contours otherwise unremarkable. Calcified granuloma in the left upper lobe, unchanged. Lungs otherwise clear.  Pulmonary vascularity normal. No visible pleural effusions. IMPRESSION: Stable mild cardiomegaly.  No acute cardiopulmonary disease. Electronically Signed   By: Evangeline Dakin M.D.   On: 10/17/2015 20:30   Dg Tibia/fibula Left Port  Result Date: 10/17/2015 CLINICAL DATA:  82 year old who may have fallen at the nursing home, though presents with an obvious left lower leg deformity. Initial encounter. EXAM: PORTABLE LEFT TIBIA AND FIBULA - 2 VIEW COMPARISON:  None. FINDINGS: Examination is performed in fiberglass splint material. Comminuted transverse fracture involving the proximal tibial metaphysis. Knee joint remains anatomically aligned. No visible fibular fracture. Visualized ankle joint intact. Severe generalized osseous demineralization. IMPRESSION: Comminuted transverse fracture involving the proximal tibial metaphysis. Electronically Signed   By: Evangeline Dakin M.D.   On: 10/17/2015 20:29     Review of Systems  Unable to perform ROS: Dementia   Blood pressure (!) 177/58, pulse 65, temperature 99 F (37.2 C), temperature source Oral, resp. rate 18, SpO2 100 %. Physical Exam  Constitutional: She is cooperative.  Comfortable appearing elderly female   Cardiovascular: Normal rate, regular rhythm, S1 normal and S2 normal.   Pulmonary/Chest:  Clear anterior fields   Abdominal: Normal appearance and bowel sounds are normal. She exhibits no distension. There is no tenderness.  Musculoskeletal:  Left Lower Extremity  Inspection:    Long leg posterior splint applied    Knee in flexion     Proximal tibia fx site in recurvatum     Leg is shortened     Ace wrap bunched up around fx site/proximal tibia   Bony eval:    TTP L proximal tibia     Profound deformity     Gross motion at fracture site     TTP L foot and ankle     Hip w/o pain on palpation   Soft tissue:  Ace wrap resting on skin    This was removed to further eval soft tissues    Extensive ecchymosis to proximal L  lower leg    Skin of proximal lower leg is wrinkled due to shortened nature    No open wounds appreciated    Some erythema to L lower leg, proximal to ankle. Likely irritation from ace wrap. No wounds noted in this area     Soft tissues to L thigh stable    Mild swelling to L ankle and foot    Dystrophic nail changes noted B   ROM:    Did not evaluate ROM of knee or hip    Pt tolerates gentle ROM of L ankle but slightly limited due to pain     Sensation:    Difficult to ascertain but DPN, SPN, TN sensation grossly intact   Motor:    EHL, FHL, lesser toe motor function grossly intact    Pt moves ankle a little     She did assist with holding her leg up when removing splint and dressings while her leg was supported   Vascular:   + DP pulse    Ext warm    Compartments are soft     Nursing note and vitals reviewed.               Vitals:   10/17/15 1734 10/17/15 2236 10/18/15 0526 10/18/15 0923  BP: (!) 166/48 (!) 115/34 (!) 158/48 (!) 177/58  Pulse: 65 66 65 65  Resp:   18   Temp: 98.4 F (36.9 C) 98.6 F (37 C) 98.4 F (36.9 C) 99 F (37.2 C)  TempSrc: Oral Oral Oral Oral  SpO2: 95% 97% 95% 100%    Assessment/Plan:  82 y/o female s/p fall out of a hoyer lift with L proximal tibia fracture   -fall from hoyer lift  - L proximal tibia fracture             Would recommend operative intervention to restore length, alignment and stability to L tibia             Surgical stabilization will also allow soft tissue to recover from this trauma              Pts soft tissue is in poor condition and do not feel that she would tolerate non-op intervention                          Fixation of her fracture will also enable pt to perform bed to chair transfers more easily              She will have unrestricted ROM of her L knee post op              She will be NWB on her L leg for 6-8 weeks (family reports that she does bear weight sometimes for  transfers)              I did remove ace wrap and splint and place pt in knee immobilizer with additional support under knee and fracture site.               Plan for OR in next several hours               Did discuss with family that pts bone quality is poor from disuse and this increases her healing time. We also discussed the possibility of  nonunion, infection. Discussed risks associated with surgery/anesthesia including but not limited to MI, stroke, death.              We believe that the benefit of surgery outweighs the risk              Family in agreement and wish to proceed               Plan is to return to [place] at [place]  - Pain management:             Continue with tylenol             Pt had oxycodone at [facility] and family states that this made the pt hyper and act bizarre                          I do think the pt needs something more that just tylenol periop--->elevated SBP may be related to pain                           Have ordered norco 5/325: 1 q8h prn severe pain   - ABL anemia/Hemodynamics             CBC stable yesterday             Monitor              Systolic HTN and Wide pulse pressure                         Widened pulse pressure likely related to age                         Pt w/o hx of HTN                                     Likely related to pain                          Will monitor   - Medical issues              Dementia/parkinsons                         Home meds  - DVT/PE prophylaxis:             lovenox post op and for 21 days post op  - ID:              periop ancef  - Metabolic Bone Disease:             Pt with hx of osteoporosis, further exacerbated by limited activity and really no WB activity              Pt on Vitamin D monthly              Check vitamin D levels  - Activity:             PT/OT consults post op  - FEN/GI prophylaxis/Foley/Lines:             NPO for now              Swallowing eval  postoperatively due to numerous risk  factors including anesthesia, dementia and poor dentition  - Impediments to fracture healing:             Severe osteoporosis              Dementia   - CODE STATUS: DO NOT RESUSCITATE  - Dispo:             OR for IMN L tibia    Jari Pigg, PA-C [facility] [phone] (P) 10/18/2015, 9:30 AM   I have seen and examined the patient. I agree with the findings above.  LLE      Severe deformity             Edema/ swelling severe             Sens: DPN, SPN, TN intact             Motor: EHL, FHL, and lessor toe ext and flex all intact grossly             Brisk cap refill, warm to touch  I discussed with the patient and her family the risks and benefits of surgery, including the death, heart attack, stroke, possibility of infection, nerve injury, vessel injury, wound breakdown, arthritis, symptomatic hardware, DVT/ PE, loss of motion, and need for further surgery among others.  They understood these risks and wished to proceed.  Rozanna Box, MD 10/18/2015 1:45PM

## 2015-11-14 NOTE — Op Note (Signed)
NAME:  Janeth RaseKILLMEIER, Shanzay              ACCOUNT NO.:  [id number]  MEDICAL RECORD NO.:  [id number]  LOCATION:  [id number]                        FACILITY:  [facility]  PHYSICIAN:  Doralee AlbinoMichael H. Carola FrostHandy, M.D. DATE OF BIRTH:  Apr 07, 1914  DATE OF PROCEDURE:  10/18/2015 DATE OF DISCHARGE:  10/20/2015                              OPERATIVE REPORT   PREOPERATIVE DIAGNOSES:  Left tibia fracture with severe deformity and threatened skin.  POSTOPERATIVE DIAGNOSES:  Left tibia fracture with severe deformity and threatened skin.  PROCEDURE:  Intramedullary nailing of the left tibia using a [employer] [place] 9 x 300 mm statically locked nail.  ASSISTANT:  Montez MoritaKeith Paul, PA-C.  ANESTHESIA:  General.  COMPLICATIONS:  None.  TOURNIQUET:  None.  DISPOSITION:  To PACU.  CONDITION:  Stable.  BRIEF SUMMARY OF INDICATION FOR PROCEDURE:  Ms. Pandora LeiterKillmeier is a very pleasant 82 year old female with dementia accompanied by her sons on transfer from [place] under Dr. Larena GlassmanKrusinski.  Because of the patient's age and severe deformity at the proximal tibia which threatens her scan, and also runs the risk of strain of the neurovascular bundle.  I recommended internal fixation as had Dr. Larena GlassmanKrusinski.  The children were in strong agreement with this recognized and the complications to include death, heart attack, stroke, infection, nerve injury, vessel injury, DVT, PE, malunion, nonunion, and symptomatic hardware, among others.  They did wish to proceed.  BRIEF SUMMARY OF PROCEDURE:  The patient was taken to the operating room where general anesthesia was induced.  A close reduction maneuver was then performed.  The patient's leg was prepped and draped in usual sterile fashion.  A radiolucent triangle was used.  A 2.5 cm incision was made medial to the patellar tendon for a parapatellar tendon retinacular incision.  The curved cannulated awl were advanced in center of the proximal plateau and then a guidewire.  A reaming was  performed and the 9 mm nail passed, static locking was performed through the guide proximally and perfect circles were used to place 2 screws distally. Wounds were irrigated thoroughly closed in standard layered fashion. The C-arm images on AP and lateral showed appropriate hardware placement, trajectory, and length in addition to acceptable reduction. The patient was placed into a well-padded dressing from foot to thigh, taken to PACU in stable condition.  Montez MoritaKeith Paul, PA-C, assisted me throughout as I held the reduction, Mr. Renae Fickleaul passed the reamer and nail.  PROGNOSIS:  The patient because of her elevated age, she is clearly at risk for perioperative complications.  She will have no range of motion restrictions, can begin early and immediate range of motion of both the knee and ankle, will be nonweightbearing and Medicine will assist us with perioperative management.     Doralee AlbinoMichael H. Carola FrostHandy, M.D.     MHH/MEDQ  D:  11/13/2015  T:  11/14/2015  Job:  [id number]

## 2015-11-28 DIAGNOSIS — G20 Parkinson's disease: Secondary | ICD-10-CM | POA: Diagnosis not present

## 2015-11-28 DIAGNOSIS — G309 Alzheimer's disease, unspecified: Secondary | ICD-10-CM | POA: Diagnosis not present

## 2015-11-28 DIAGNOSIS — I872 Venous insufficiency (chronic) (peripheral): Secondary | ICD-10-CM | POA: Diagnosis not present

## 2015-11-28 DIAGNOSIS — F39 Unspecified mood [affective] disorder: Secondary | ICD-10-CM | POA: Diagnosis not present

## 2015-12-16 DIAGNOSIS — J209 Acute bronchitis, unspecified: Secondary | ICD-10-CM | POA: Diagnosis not present

## 2016-01-03 DIAGNOSIS — L710 Perioral dermatitis: Secondary | ICD-10-CM | POA: Diagnosis not present

## 2016-02-07 DIAGNOSIS — I872 Venous insufficiency (chronic) (peripheral): Secondary | ICD-10-CM

## 2016-02-07 DIAGNOSIS — F39 Unspecified mood [affective] disorder: Secondary | ICD-10-CM | POA: Diagnosis not present

## 2016-02-07 DIAGNOSIS — G20 Parkinson's disease: Secondary | ICD-10-CM

## 2016-02-07 DIAGNOSIS — F0280 Dementia in other diseases classified elsewhere without behavioral disturbance: Secondary | ICD-10-CM | POA: Diagnosis not present

## 2016-03-26 DIAGNOSIS — G20 Parkinson's disease: Secondary | ICD-10-CM

## 2016-03-26 DIAGNOSIS — F22 Delusional disorders: Secondary | ICD-10-CM | POA: Diagnosis not present

## 2016-03-26 DIAGNOSIS — G219 Secondary parkinsonism, unspecified: Secondary | ICD-10-CM

## 2016-03-26 DIAGNOSIS — I872 Venous insufficiency (chronic) (peripheral): Secondary | ICD-10-CM | POA: Diagnosis not present

## 2016-05-28 DIAGNOSIS — H9190 Unspecified hearing loss, unspecified ear: Secondary | ICD-10-CM

## 2016-05-29 DIAGNOSIS — F39 Unspecified mood [affective] disorder: Secondary | ICD-10-CM | POA: Diagnosis not present

## 2016-05-29 DIAGNOSIS — F0280 Dementia in other diseases classified elsewhere without behavioral disturbance: Secondary | ICD-10-CM | POA: Diagnosis not present

## 2016-05-29 DIAGNOSIS — I872 Venous insufficiency (chronic) (peripheral): Secondary | ICD-10-CM | POA: Diagnosis not present

## 2016-05-29 DIAGNOSIS — G20 Parkinson's disease: Secondary | ICD-10-CM

## 2016-08-06 DIAGNOSIS — F0280 Dementia in other diseases classified elsewhere without behavioral disturbance: Secondary | ICD-10-CM | POA: Diagnosis not present

## 2016-08-06 DIAGNOSIS — F39 Unspecified mood [affective] disorder: Secondary | ICD-10-CM | POA: Diagnosis not present

## 2016-08-06 DIAGNOSIS — I872 Venous insufficiency (chronic) (peripheral): Secondary | ICD-10-CM | POA: Diagnosis not present

## 2016-08-06 DIAGNOSIS — G20 Parkinson's disease: Secondary | ICD-10-CM | POA: Diagnosis not present

## 2016-09-08 ENCOUNTER — Ambulatory Visit (INDEPENDENT_AMBULATORY_CARE_PROVIDER_SITE_OTHER): Payer: Federal, State, Local not specified - PPO

## 2016-09-08 ENCOUNTER — Ambulatory Visit (INDEPENDENT_AMBULATORY_CARE_PROVIDER_SITE_OTHER): Payer: Federal, State, Local not specified - PPO | Admitting: Podiatry

## 2016-09-08 DIAGNOSIS — R52 Pain, unspecified: Secondary | ICD-10-CM

## 2016-09-08 DIAGNOSIS — M79675 Pain in left toe(s): Secondary | ICD-10-CM

## 2016-09-08 DIAGNOSIS — M79674 Pain in right toe(s): Secondary | ICD-10-CM | POA: Diagnosis not present

## 2016-09-08 DIAGNOSIS — B351 Tinea unguium: Secondary | ICD-10-CM | POA: Diagnosis not present

## 2016-09-08 DIAGNOSIS — M2041 Other hammer toe(s) (acquired), right foot: Secondary | ICD-10-CM

## 2016-09-08 DIAGNOSIS — I739 Peripheral vascular disease, unspecified: Secondary | ICD-10-CM

## 2016-09-08 DIAGNOSIS — M79671 Pain in right foot: Secondary | ICD-10-CM | POA: Diagnosis not present

## 2016-09-08 NOTE — Progress Notes (Signed)
HPI  Complaint:  Visit Type: Patient returns to my office for continued preventative foot care services. Complaint: Patient states" my nails have grown long and thick and become painful to walk and wear shoes" Patient has been diagnosed with DM . This patient  presents for preventative foot care services. No changes to ROS  Podiatric Exam: Vascular: dorsalis pedis and posterior tibial pulses are non-palpable. Capillary return is diminished. Cold feet noted.. Skin turgor WNL, bilateral swelling  Sensorium: Diminished  Semmes Weinstein monofilament test. Diminished  tactile sensation bilaterally.  Nail Exam: Pt has thick disfigured discolored nails with subungual debris noted bilateral entire nail hallux through fifth toenails.  No evidence of bacterial infection or drainage. Ulcer Exam: There is no evidence of ulcer or pre-ulcerative changes or infection. Orthopedic Exam: Muscle tone and strength are WNL. No limitations in general ROM. No crepitus or effusions noted. Foot type and digits show no abnormalities. Bony prominences are unremarkable.Screw head is evident on the distal aspect fourth toe right foot. Skin: No Porokeratosis. No infection or ulcers  Diagnosis:  Onychomycosis, Pain in right toe, pain in left toes,  screw noted on the fourth toe of the right foot, penetrating through and causing an ulcer on the distal aspect of the fourth toe, right foot  Treatment & Plan Procedures and Treatment: Consent by patient was obtained for treatment procedures. The patient understood the discussion of treatment and procedures well. All questions were answered thoroughly reviewed. Debridement of mycotic and hypertrophic toenails, 1 through 5 bilateral and clearing of subungual debris. No ulceration, no infection noted. X-rays taken do reveal the presence of a screw on the tip of the fourth toe of the right foot with the screw extending to the base of the proximal phalanx. Return Visit-Office  Procedure: Patient instructed to return to the office for a follow up visit 3 months for continued evaluation and treatment. Discussed the presence of the foreign body/screw on the fourth toe right and there is no evidence of any infection or inflammation noted.  The head appears to be sticking out and is painful to the touch.  No drainage, redness, inflammation noted.  Discussed this condition with her son and her son suggested that we just watch the screw and keep an eye on the fourth toe and if there are changes, then we will address them.  Return to clinic in 3 months for further evaluation and treatment of her nails    Helane Gunther DPM

## 2016-09-13 ENCOUNTER — Encounter: Payer: Self-pay | Admitting: Family Medicine

## 2016-09-13 NOTE — Progress Notes (Signed)
Call from [facility] from [place] re: patient with hematuria and urine with strong odor. No rectal or vaginal bleed. Vitals stable/WNL. Afebrile. No skin changes. Medications and most recent BMP reviewed. UA with micro and UCx verbal order given. Okay to start Cipro 250 mg po BID x 7 days while waiting on UCx results. Will route message to Dr. Alphonsus Sias.

## 2016-09-15 ENCOUNTER — Telehealth: Payer: Self-pay

## 2016-09-15 NOTE — Progress Notes (Signed)
Thank you We just got a new DON (after a prolonged period without one)and 1st shift supervisor and they are working on all our protocols and formal SBAR (situation, backround, assessment, recommendations) training for the nursing staff

## 2016-09-15 NOTE — Progress Notes (Signed)
Noted Discussed with staff that there is now a standing order to send urine and start 3 day Rx pending culture for dysuria or hematuria. Sorry you were bothered.

## 2016-09-15 NOTE — Telephone Encounter (Signed)
See other note

## 2016-09-15 NOTE — Progress Notes (Signed)
Oh, no worries. I was actually concerned about my first call, but I have to admit that my experience with the [facility] staff was pleasant. Only two calls, both very reasonable.   I chose Cipro knowing that you could adjust quickly. I don't have a [facility] site antibiogram, but will try to get my hands on one for your area.   I'll make sure to let the folks in the Executive Committee know that the experience was good and that you have a protocol in place.  Thanks! Alcario Drought

## 2016-09-15 NOTE — Telephone Encounter (Signed)
PLEASE NOTE: All timestamps contained within this report are represented as [place] Standard Time. CONFIDENTIALTY NOTICE: This fax transmission is intended only for the addressee. It contains information that is legally privileged, confidential or otherwise protected from use or disclosure. If you are not the intended recipient, you are strictly prohibited from reviewing, disclosing, copying using or disseminating any of this information or taking any action in reliance on or regarding this information. If you have received this fax in error, please notify us immediately by telephone so that we can arrange for its return to [place]. Phone: [phone], Toll-Free: [phone], Fax: [phone] Page: 1 of 1 Call Id: [id number] [facility] Night - Client Nonclinical Telephone Record [facility] Client [facility] Night - Client Client Site [facility] - Night Physician Tillman Abide - MD Contact Type Call Who Is Calling Physician / Provider / Hospital Call Type Provider Call [facility] Page Now Reason for Call Request to speak to Physician Initial Comment Caller states she is taking care of a mutual patient who has significant bleeding and odor to her urine. Additional Comment Patient Name Rachel Faulkner Patient DOB 1914/12/23. Requesting Provider Imagene Sheller Physician Number [id number] Facility Name [facility] Paging DoctorName Phone DateTime Result/Outcome Message Type Notes Helane Rima- DO [phone] 09/13/2016 12:01:45 PM Called On Call Provider - Reached Doctor Paged Helane Rima- DO 09/13/2016 12:01:49 PM Spoke with On Call - General Message Result Call Closed By: Gasper Sells Transaction Date/Time: 09/13/2016 11:54:04 AM (ET)

## 2016-09-23 ENCOUNTER — Telehealth: Payer: Self-pay | Admitting: *Deleted

## 2016-09-23 DIAGNOSIS — S91331A Puncture wound without foreign body, right foot, initial encounter: Secondary | ICD-10-CM | POA: Diagnosis not present

## 2016-09-23 NOTE — Telephone Encounter (Signed)
Manon Hild[place]Becky Banks [place] states she needs to ask a question about pt's LOV 09/08/2016.

## 2016-09-24 NOTE — Telephone Encounter (Signed)
Returned call, Kriste BasqueBecky needed follow up appt time/day for patient, son gave information to [place]

## 2016-10-01 ENCOUNTER — Ambulatory Visit (INDEPENDENT_AMBULATORY_CARE_PROVIDER_SITE_OTHER): Payer: Federal, State, Local not specified - PPO | Admitting: Podiatry

## 2016-10-01 ENCOUNTER — Encounter: Payer: Self-pay | Admitting: Podiatry

## 2016-10-01 DIAGNOSIS — T84498A Other mechanical complication of other internal orthopedic devices, implants and grafts, initial encounter: Secondary | ICD-10-CM

## 2016-10-01 NOTE — Progress Notes (Signed)
She presents today with the chief complaint of a screw sticking out of the end of her fourth toe on her right foot. She states that she had a hammertoe done many many years ago and the screw was starting to back out as she has gotten older. She states that the screw does not hurt.  Objective: Pulses are palpable right lower extremity there appears to be a small screw sticking out of the end of her fourth toe right foot. There is no erythema cellulitis drainage or odor.  Assessment painful internal fixation.  Plan: After local anesthetic and a Betadine prep I was unable to remove the screw in toto. No. Less than malodor  Dressing toe dress her compressive dressing she will start changing the dressing tomorrow follow up with me in 1-2 weeks.

## 2016-10-08 ENCOUNTER — Encounter: Payer: Self-pay | Admitting: Podiatry

## 2016-10-08 ENCOUNTER — Ambulatory Visit (INDEPENDENT_AMBULATORY_CARE_PROVIDER_SITE_OTHER): Payer: Federal, State, Local not specified - PPO | Admitting: Podiatry

## 2016-10-08 DIAGNOSIS — T84498A Other mechanical complication of other internal orthopedic devices, implants and grafts, initial encounter: Secondary | ICD-10-CM

## 2016-10-08 NOTE — Progress Notes (Signed)
She presents today 1 week status post  removal screw fourth toe right foot. She states that she is doing just fine toe still a little tender.  Objective: There is no erythema edema cellulitis drainage or odor pulses are palpable no signs of infection the wound site has gone on to close 100%.  Assessment: Well-healing surgical foot right.  Plan: Follow up with me as needed.

## 2016-11-28 DIAGNOSIS — G20 Parkinson's disease: Secondary | ICD-10-CM

## 2016-11-28 DIAGNOSIS — F39 Unspecified mood [affective] disorder: Secondary | ICD-10-CM

## 2016-11-28 DIAGNOSIS — I872 Venous insufficiency (chronic) (peripheral): Secondary | ICD-10-CM

## 2016-12-08 ENCOUNTER — Ambulatory Visit (INDEPENDENT_AMBULATORY_CARE_PROVIDER_SITE_OTHER): Payer: Federal, State, Local not specified - PPO | Admitting: Podiatry

## 2016-12-08 ENCOUNTER — Encounter: Payer: Self-pay | Admitting: Podiatry

## 2016-12-08 DIAGNOSIS — M79675 Pain in left toe(s): Secondary | ICD-10-CM

## 2016-12-08 DIAGNOSIS — M79674 Pain in right toe(s): Secondary | ICD-10-CM | POA: Diagnosis not present

## 2016-12-08 DIAGNOSIS — I739 Peripheral vascular disease, unspecified: Secondary | ICD-10-CM

## 2016-12-08 DIAGNOSIS — B351 Tinea unguium: Secondary | ICD-10-CM | POA: Diagnosis not present

## 2016-12-08 NOTE — Progress Notes (Signed)
HPI  Complaint:  Visit Type: Patient returns to my office for continued preventative foot care services. Complaint: Patient states" my nails have grown long and thick and become painful to walk and wear shoes" Patient has been diagnosed with DM . This patient  presents for preventative foot care services. No changes to ROS  Podiatric Exam: Vascular: dorsalis pedis and posterior tibial pulses are non-palpable. Capillary return is diminished. Cold feet noted.. Skin turgor WNL, bilateral swelling  Sensorium: Diminished  Semmes Weinstein monofilament test. Diminished  tactile sensation bilaterally.  Nail Exam: Pt has thick disfigured discolored nails with subungual debris noted bilateral entire nail hallux through fifth toenails.  No evidence of bacterial infection or drainage. Ulcer Exam: There is no evidence of ulcer or pre-ulcerative changes or infection. Orthopedic Exam: Muscle tone and strength are WNL. No limitations in general ROM. No crepitus or effusions noted. Foot type and digits show no abnormalities. Bony prominences are unremarkable. Skin: No Porokeratosis. No infection or ulcers  Diagnosis:  Onychomycosis, Pain in right toe, pain in left toes,  screw noted on the fourth toe of the right foot, penetrating through and causing an ulcer on the distal aspect of the fourth toe, right foot  Treatment & Plan Procedures and Treatment: Consent by patient was obtained for treatment procedures. The patient understood the discussion of treatment and procedures well. All questions were answered thoroughly reviewed. Debridement of mycotic and hypertrophic toenails, 1 through 5 bilateral and clearing of subungual debris. No ulceration, no infection noted.  Return Visit-Office Procedure: Patient instructed to return to the office for a follow up visit 3 months for continued evaluation and treatment.     Helane GuntherGregory Jaysiah Marchetta DPM

## 2017-02-03 DIAGNOSIS — G20 Parkinson's disease: Secondary | ICD-10-CM | POA: Diagnosis not present

## 2017-02-03 DIAGNOSIS — M25551 Pain in right hip: Secondary | ICD-10-CM | POA: Diagnosis not present

## 2017-02-03 DIAGNOSIS — G3183 Dementia with Lewy bodies: Secondary | ICD-10-CM | POA: Diagnosis not present

## 2017-02-03 DIAGNOSIS — F39 Unspecified mood [affective] disorder: Secondary | ICD-10-CM | POA: Diagnosis not present

## 2017-03-27 DIAGNOSIS — G20 Parkinson's disease: Secondary | ICD-10-CM | POA: Diagnosis not present

## 2017-03-27 DIAGNOSIS — F39 Unspecified mood [affective] disorder: Secondary | ICD-10-CM | POA: Diagnosis not present

## 2017-06-01 DIAGNOSIS — J309 Allergic rhinitis, unspecified: Secondary | ICD-10-CM | POA: Diagnosis not present

## 2017-06-01 DIAGNOSIS — G3183 Dementia with Lewy bodies: Secondary | ICD-10-CM | POA: Diagnosis not present

## 2017-06-01 DIAGNOSIS — G20 Parkinson's disease: Secondary | ICD-10-CM | POA: Diagnosis not present

## 2017-06-08 ENCOUNTER — Ambulatory Visit: Payer: Federal, State, Local not specified - PPO | Admitting: Podiatry

## 2017-07-06 ENCOUNTER — Encounter: Payer: Self-pay | Admitting: Podiatry

## 2017-07-06 ENCOUNTER — Ambulatory Visit (INDEPENDENT_AMBULATORY_CARE_PROVIDER_SITE_OTHER): Payer: Federal, State, Local not specified - PPO | Admitting: Podiatry

## 2017-07-06 DIAGNOSIS — M79675 Pain in left toe(s): Secondary | ICD-10-CM

## 2017-07-06 DIAGNOSIS — B351 Tinea unguium: Secondary | ICD-10-CM

## 2017-07-06 DIAGNOSIS — I739 Peripheral vascular disease, unspecified: Secondary | ICD-10-CM | POA: Diagnosis not present

## 2017-07-06 DIAGNOSIS — M79674 Pain in right toe(s): Secondary | ICD-10-CM | POA: Diagnosis not present

## 2017-07-06 NOTE — Progress Notes (Signed)
HPI  Complaint:  Visit Type: Patient returns to my office for continued preventative foot care services. Complaint: Patient states" my nails have grown long and thick and become painful to walk and wear shoes" Patient has been diagnosed with DM . This patient  presents for preventative foot care services. No changes to ROS  Podiatric Exam: Vascular: dorsalis pedis and posterior tibial pulses are non-palpable. Capillary return is diminished. Cold feet noted.. Skin turgor WNL, bilateral swelling  Sensorium: Diminished  Semmes Weinstein monofilament test. Diminished  tactile sensation bilaterally.  Nail Exam: Pt has thick disfigured discolored nails with subungual debris noted bilateral entire nail hallux through fifth toenails.  No evidence of bacterial infection or drainage. Ulcer Exam: There is no evidence of ulcer or pre-ulcerative changes or infection. Orthopedic Exam: Muscle tone and strength are WNL. No limitations in general ROM. No crepitus or effusions noted. Foot type and digits show no abnormalities. Bony prominences are unremarkable. Skin: No Porokeratosis. No infection or ulcers  Diagnosis:  Onychomycosis, Pain in right toe, pain in left toes,  screw noted on the fourth toe of the right foot, penetrating through and causing an ulcer on the distal aspect of the fourth toe, right foot  Treatment & Plan Procedures and Treatment: Consent by patient was obtained for treatment procedures. The patient understood the discussion of treatment and procedures well. All questions were answered thoroughly reviewed. Debridement of mycotic and hypertrophic toenails, 1 through 5 bilateral and clearing of subungual debris. No ulceration, no infection noted. Patient was screaming and moving making my services difficult to perform. I needed to use the dremel mostly for fear of her jumping and moving her foot when she screamed. Return Visit-Office Procedure: Patient instructed to return to the office for  a follow up visit 3 months for continued evaluation and treatment.     Helane GuntherGregory Sherrell Farish DPM

## 2017-08-05 DIAGNOSIS — F0280 Dementia in other diseases classified elsewhere without behavioral disturbance: Secondary | ICD-10-CM | POA: Diagnosis not present

## 2017-08-05 DIAGNOSIS — M1990 Unspecified osteoarthritis, unspecified site: Secondary | ICD-10-CM | POA: Diagnosis not present

## 2017-08-05 DIAGNOSIS — G20 Parkinson's disease: Secondary | ICD-10-CM | POA: Diagnosis not present

## 2017-08-05 DIAGNOSIS — J302 Other seasonal allergic rhinitis: Secondary | ICD-10-CM | POA: Diagnosis not present

## 2017-09-29 DIAGNOSIS — L97429 Non-pressure chronic ulcer of left heel and midfoot with unspecified severity: Secondary | ICD-10-CM | POA: Diagnosis not present

## 2017-09-29 DIAGNOSIS — M159 Polyosteoarthritis, unspecified: Secondary | ICD-10-CM | POA: Diagnosis not present

## 2017-09-29 DIAGNOSIS — G20 Parkinson's disease: Secondary | ICD-10-CM | POA: Diagnosis not present

## 2017-09-29 DIAGNOSIS — G3183 Dementia with Lewy bodies: Secondary | ICD-10-CM

## 2017-10-05 ENCOUNTER — Ambulatory Visit: Payer: Federal, State, Local not specified - PPO | Admitting: Podiatry

## 2017-10-08 ENCOUNTER — Ambulatory Visit (INDEPENDENT_AMBULATORY_CARE_PROVIDER_SITE_OTHER): Payer: Federal, State, Local not specified - PPO | Admitting: Podiatry

## 2017-10-08 ENCOUNTER — Encounter: Payer: Self-pay | Admitting: Podiatry

## 2017-10-08 DIAGNOSIS — M79675 Pain in left toe(s): Secondary | ICD-10-CM | POA: Diagnosis not present

## 2017-10-08 DIAGNOSIS — I739 Peripheral vascular disease, unspecified: Secondary | ICD-10-CM

## 2017-10-08 DIAGNOSIS — B351 Tinea unguium: Secondary | ICD-10-CM | POA: Diagnosis not present

## 2017-10-08 DIAGNOSIS — M79674 Pain in right toe(s): Secondary | ICD-10-CM

## 2017-10-08 NOTE — Progress Notes (Signed)
HPI  Complaint:  Visit Type: Patient returns to my office for continued preventative foot care services. Complaint: Patient states" my nails have grown long and thick and become painful to walk and wear shoes" Patient has been diagnosed with DM . This patient  presents for preventative foot care services. No changes to ROS  Podiatric Exam: Vascular: dorsalis pedis and posterior tibial pulses are non-palpable. Capillary return is diminished. Cold feet noted.. Skin turgor WNL, bilateral swelling  Sensorium: Diminished  Semmes Weinstein monofilament test. Diminished  tactile sensation bilaterally.  Nail Exam: Pt has thick disfigured discolored nails with subungual debris noted bilateral entire nail hallux through fifth toenails.  No evidence of bacterial infection or drainage. Ulcer Exam: There is no evidence of ulcer or pre-ulcerative changes or infection. Orthopedic Exam: Muscle tone and strength are WNL. No limitations in general ROM. No crepitus or effusions noted. Foot type and digits show no abnormalities. Bony prominences are unremarkable. Skin: No Porokeratosis. No infection or ulcers  Diagnosis:  Onychomycosis, Pain in right toe, pain in left toes,    Treatment & Plan Procedures and Treatment: Consent by patient was obtained for treatment procedures. The patient understood the discussion of treatment and procedures well. All questions were answered thoroughly reviewed. Debridement of mycotic and hypertrophic toenails, 1 through 5 bilateral and clearing of subungual debris. No ulceration, no infection noted. Patient was better and trimming of nails were performed today with the dremel tool.  Patient also is wearing a bandage on left heel  I was unable to fee an ulcer nor did I see an ulcer.  Patient says she has no pain in her left heel.  I did rebandage the heel when she left the room.. Return Visit-Office Procedure: Patient instructed to return to the office for a follow up visit 3  months for continued evaluation and treatment.     Helane GuntherGregory Alfonso Carden DPM

## 2017-12-02 DIAGNOSIS — G3183 Dementia with Lewy bodies: Secondary | ICD-10-CM

## 2017-12-02 DIAGNOSIS — M1990 Unspecified osteoarthritis, unspecified site: Secondary | ICD-10-CM

## 2017-12-02 DIAGNOSIS — L89610 Pressure ulcer of right heel, unstageable: Secondary | ICD-10-CM

## 2017-12-02 DIAGNOSIS — G20 Parkinson's disease: Secondary | ICD-10-CM

## 2017-12-12 IMAGING — CR DG TIBIA/FIBULA PORT 2V*L*
2 series · 2 of 2 positions shown · non-contrast
Comparison: Intraoperative left tibia fibula x-rays earlier today
at [DATE] p.m.. Left tibia fibula x-rays yesterday.

CLINICAL DATA: Postop day 0 ORIF of a comminuted proximal tibial
fracture.

EXAM:
PORTABLE LEFT TIBIA AND FIBULA - 2 VIEW

[lateral]
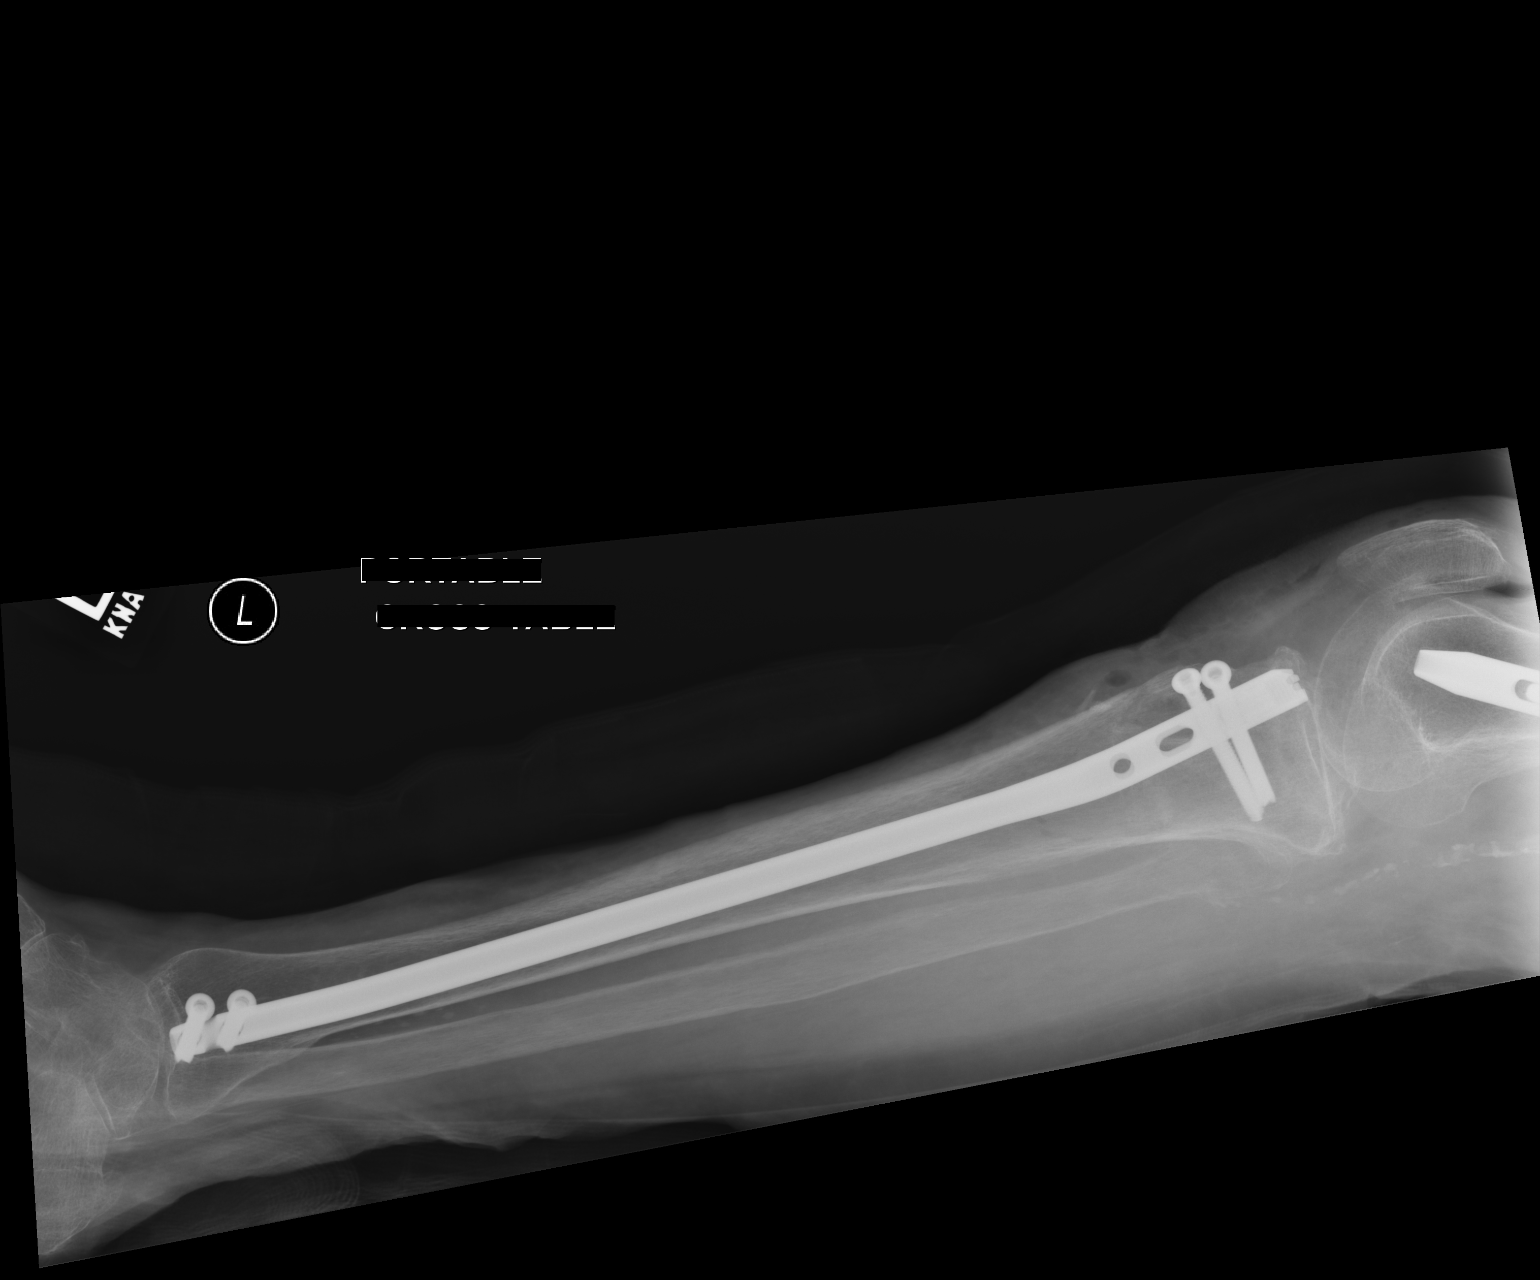

[AP]
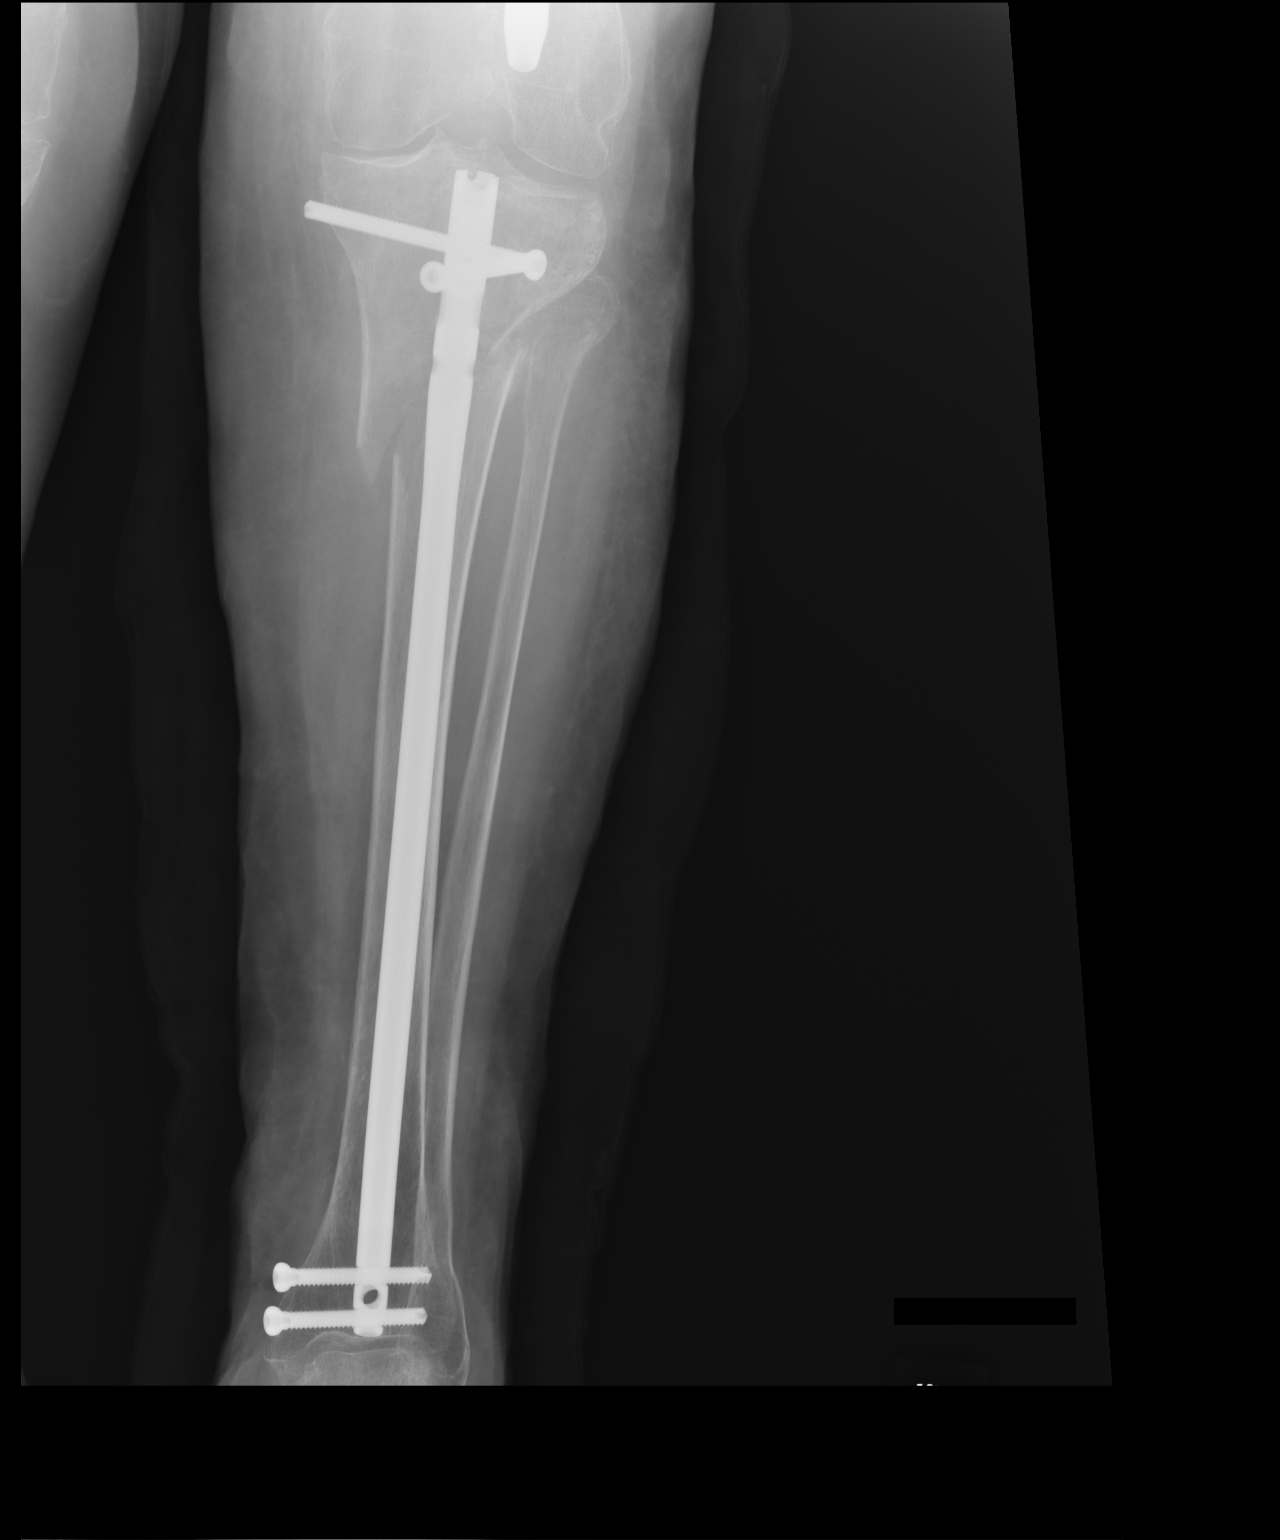

[2 of 2 positions shown; findings below may reference images not displayed]

FINDINGS: Placement of an intramedullary nail through the fracture involving
the proximal tibial metaphysis. No acute complicating features.
Likely nondisplaced fracture involving the fibular head. Knee joint
anatomically aligned with mild tricompartment joint space narrowing.
Visualized ankle joint intact. Severe osseous demineralization.
IMPRESSION: ORIF of the comminuted proximal tibial metaphysis seal fracture with
an intramedullary nail. Likely nondisplaced fracture involving the
fibular head.

## 2018-01-07 ENCOUNTER — Encounter: Payer: Self-pay | Admitting: Podiatry

## 2018-01-07 ENCOUNTER — Ambulatory Visit (INDEPENDENT_AMBULATORY_CARE_PROVIDER_SITE_OTHER): Payer: Federal, State, Local not specified - PPO | Admitting: Podiatry

## 2018-01-07 DIAGNOSIS — M79675 Pain in left toe(s): Secondary | ICD-10-CM | POA: Diagnosis not present

## 2018-01-07 DIAGNOSIS — B351 Tinea unguium: Secondary | ICD-10-CM

## 2018-01-07 DIAGNOSIS — I739 Peripheral vascular disease, unspecified: Secondary | ICD-10-CM

## 2018-01-07 DIAGNOSIS — M79674 Pain in right toe(s): Secondary | ICD-10-CM

## 2018-01-07 NOTE — Progress Notes (Signed)
HPI  Complaint:  Visit Type: Patient returns to my office for continued preventative foot care services. Complaint: Patient states" my nails have grown long and thick and become painful to walk and wear shoes" Patient has been diagnosed with DM . This patient  presents for preventative foot care services. No changes to ROS.  She presents to the office by her son.  Podiatric Exam: Vascular: dorsalis pedis and posterior tibial pulses are non-palpable. Capillary return is diminished. Cold feet noted.. Skin turgor WNL, bilateral swelling  Sensorium: Diminished  Semmes Weinstein monofilament test. Diminished  tactile sensation bilaterally.  Nail Exam: Pt has thick disfigured discolored nails with subungual debris noted bilateral entire nail hallux through fifth toenails.  No evidence of bacterial infection or drainage. Ulcer Exam: There is no evidence of ulcer or pre-ulcerative changes or infection. Orthopedic Exam: Muscle tone and strength are WNL. No limitations in general ROM. No crepitus or effusions noted. Foot type and digits show no abnormalities. Bony prominences are unremarkable. Skin: No Porokeratosis. No infection or ulcers  Diagnosis:  Onychomycosis, Pain in right toe, pain in left toes,  screw noted on the fourth toe of the right foot, penetrating through and causing an ulcer on the distal aspect of the fourth toe, right foot  Treatment & Plan Procedures and Treatment: Consent by patient was obtained for treatment procedures. The patient understood the discussion of treatment and procedures well. All questions were answered thoroughly reviewed. Debridement of mycotic and hypertrophic toenails, 1 through 5 bilateral and clearing of subungual debris. No ulceration, no infection noted. Patient is under care fir her heel callus by home. Return Visit-Office Procedure: Patient instructed to return to the office for a follow up visit 3 months for continued evaluation and treatment.      Helane GuntherGregory Brinly Maietta DPM

## 2018-01-28 DIAGNOSIS — E441 Mild protein-calorie malnutrition: Secondary | ICD-10-CM

## 2018-01-28 DIAGNOSIS — G20 Parkinson's disease: Secondary | ICD-10-CM

## 2018-01-28 DIAGNOSIS — F0280 Dementia in other diseases classified elsewhere without behavioral disturbance: Secondary | ICD-10-CM

## 2018-01-28 DIAGNOSIS — M159 Polyosteoarthritis, unspecified: Secondary | ICD-10-CM

## 2018-02-25 DIAGNOSIS — E440 Moderate protein-calorie malnutrition: Secondary | ICD-10-CM | POA: Diagnosis not present

## 2018-04-02 DIAGNOSIS — E43 Unspecified severe protein-calorie malnutrition: Secondary | ICD-10-CM

## 2018-04-02 DIAGNOSIS — G20 Parkinson's disease: Secondary | ICD-10-CM

## 2018-04-02 DIAGNOSIS — M1990 Unspecified osteoarthritis, unspecified site: Secondary | ICD-10-CM

## 2018-04-02 DIAGNOSIS — G3183 Dementia with Lewy bodies: Secondary | ICD-10-CM | POA: Diagnosis not present

## 2018-04-12 ENCOUNTER — Ambulatory Visit: Payer: Federal, State, Local not specified - PPO | Admitting: Podiatry

## 2018-05-06 ENCOUNTER — Ambulatory Visit: Payer: Federal, State, Local not specified - PPO | Admitting: Podiatry

## 2018-05-25 DIAGNOSIS — F0280 Dementia in other diseases classified elsewhere without behavioral disturbance: Secondary | ICD-10-CM | POA: Diagnosis not present

## 2018-05-25 DIAGNOSIS — G20 Parkinson's disease: Secondary | ICD-10-CM | POA: Diagnosis not present

## 2018-05-25 DIAGNOSIS — E441 Mild protein-calorie malnutrition: Secondary | ICD-10-CM

## 2018-05-25 DIAGNOSIS — M159 Polyosteoarthritis, unspecified: Secondary | ICD-10-CM | POA: Diagnosis not present

## 2018-06-03 ENCOUNTER — Ambulatory Visit: Payer: Federal, State, Local not specified - PPO | Admitting: Podiatry

## 2018-07-28 DIAGNOSIS — F0151 Vascular dementia with behavioral disturbance: Secondary | ICD-10-CM

## 2018-07-28 DIAGNOSIS — E43 Unspecified severe protein-calorie malnutrition: Secondary | ICD-10-CM

## 2018-07-28 DIAGNOSIS — G3183 Dementia with Lewy bodies: Secondary | ICD-10-CM

## 2018-07-28 DIAGNOSIS — M1990 Unspecified osteoarthritis, unspecified site: Secondary | ICD-10-CM | POA: Diagnosis not present

## 2018-07-28 DIAGNOSIS — F39 Unspecified mood [affective] disorder: Secondary | ICD-10-CM

## 2018-08-05 ENCOUNTER — Ambulatory Visit (INDEPENDENT_AMBULATORY_CARE_PROVIDER_SITE_OTHER): Payer: Federal, State, Local not specified - PPO | Admitting: Podiatry

## 2018-08-05 ENCOUNTER — Encounter: Payer: Self-pay | Admitting: Podiatry

## 2018-08-05 ENCOUNTER — Other Ambulatory Visit: Payer: Self-pay

## 2018-08-05 VITALS — Temp 97.3°F

## 2018-08-05 DIAGNOSIS — M79675 Pain in left toe(s): Secondary | ICD-10-CM | POA: Diagnosis not present

## 2018-08-05 DIAGNOSIS — I739 Peripheral vascular disease, unspecified: Secondary | ICD-10-CM | POA: Diagnosis not present

## 2018-08-05 DIAGNOSIS — M79674 Pain in right toe(s): Secondary | ICD-10-CM

## 2018-08-05 DIAGNOSIS — B351 Tinea unguium: Secondary | ICD-10-CM

## 2018-08-05 NOTE — Progress Notes (Signed)
HPI  Complaint:  Visit Type: Patient returns to my office for continued preventative foot care services. Complaint: Patient states" my nails have grown long and thick and become painful to walk and wear shoes" Patient has been diagnosed with DM . This patient  presents for preventative foot care services. No changes to ROS.  She presents to the office by her caregiver from [facility]..  Podiatric Exam: Vascular: dorsalis pedis and posterior tibial pulses are non-palpable. Capillary return is diminished. Cold feet noted.. Skin turgor WNL, bilateral swelling  Sensorium: Diminished  Semmes Weinstein monofilament test. Diminished  tactile sensation bilaterally.  Nail Exam: Pt has thick disfigured discolored nails with subungual debris noted bilateral entire nail hallux through fifth toenails.  No evidence of bacterial infection or drainage. Ulcer Exam: There is no evidence of ulcer or pre-ulcerative changes or infection. Orthopedic Exam: Muscle tone and strength are WNL. No limitations in general ROM. No crepitus or effusions noted. Foot type and digits show no abnormalities. Bony prominences are unremarkable. Skin: No Porokeratosis. No infection or ulcers  Diagnosis:  Onychomycosis, Pain in right toe, pain in left toes,   Treatment & Plan Procedures and Treatment: Consent by patient was obtained for treatment procedures. The patient understood the discussion of treatment and procedures well. All questions were answered thoroughly reviewed. Debridement of mycotic and hypertrophic toenails, 1 through 5 bilateral and clearing of subungual debris. No ulceration, no infection noted.  Return Visit-Office Procedure: Patient instructed to return to the office for a follow up visit 3 months for continued evaluation and treatment.     Gardiner Barefoot DPM

## 2018-09-21 DIAGNOSIS — M159 Polyosteoarthritis, unspecified: Secondary | ICD-10-CM | POA: Diagnosis not present

## 2018-09-21 DIAGNOSIS — F39 Unspecified mood [affective] disorder: Secondary | ICD-10-CM | POA: Diagnosis not present

## 2018-09-21 DIAGNOSIS — F0280 Dementia in other diseases classified elsewhere without behavioral disturbance: Secondary | ICD-10-CM

## 2018-09-21 DIAGNOSIS — G20 Parkinson's disease: Secondary | ICD-10-CM | POA: Diagnosis not present

## 2018-11-04 ENCOUNTER — Encounter: Payer: Self-pay | Admitting: Podiatry

## 2018-11-04 ENCOUNTER — Other Ambulatory Visit: Payer: Self-pay

## 2018-11-04 ENCOUNTER — Ambulatory Visit (INDEPENDENT_AMBULATORY_CARE_PROVIDER_SITE_OTHER): Payer: Federal, State, Local not specified - PPO | Admitting: Podiatry

## 2018-11-04 DIAGNOSIS — M79674 Pain in right toe(s): Secondary | ICD-10-CM | POA: Diagnosis not present

## 2018-11-04 DIAGNOSIS — S99922A Unspecified injury of left foot, initial encounter: Secondary | ICD-10-CM | POA: Diagnosis not present

## 2018-11-04 DIAGNOSIS — M79675 Pain in left toe(s): Secondary | ICD-10-CM

## 2018-11-04 DIAGNOSIS — I739 Peripheral vascular disease, unspecified: Secondary | ICD-10-CM

## 2018-11-04 DIAGNOSIS — B351 Tinea unguium: Secondary | ICD-10-CM | POA: Diagnosis not present

## 2018-11-04 NOTE — Progress Notes (Signed)
HPI  Complaint:  Visit Type: Patient returns to my office for continued preventative foot care services. Complaint: Patient states" my nails have grown long and thick and become painful to walk and wear shoes" Patient has been diagnosed with DM . This patient  presents for preventative foot care services. No changes to ROS.  She presents to the office with her son.  Her son remarks the second toenail left foot is elevated. .  Podiatric Exam: Vascular: dorsalis pedis and posterior tibial pulses are non-palpable. Capillary return is diminished. Cold feet noted.. Skin turgor WNL, bilateral swelling .  Purplish feet noted. Sensorium: Diminished  Semmes Weinstein monofilament test. Diminished  tactile sensation bilaterally.  Nail Exam: Pt has thick disfigured discolored nails with subungual debris noted bilateral entire nail hallux through fifth toenails.  No evidence of bacterial infection or drainage. E examination of her second toe left foot reveals the nail has detached from the proximal nail border .  The proximal nail is unattached from the nail bed .  No evidence of any redness swelling or pus noted .   Ulcer Exam: There is no evidence of ulcer or pre-ulcerative changes or infection. Orthopedic Exam: Muscle tone and strength are WNL. No limitations in general ROM. No crepitus or effusions noted. Foot type and digits show no abnormalities. Bony prominences are unremarkable. Skin: No Porokeratosis. No infection or ulcers  Diagnosis:  Onychomycosis, Pain in right toe, pain in left toes, Nail Injury second toenail left foot.  Treatment & Plan Procedures and Treatment: Consent by patient was obtained for treatment procedures. The patient understood the discussion of treatment and procedures well. All questions were answered thoroughly reviewed. Debridement of mycotic and hypertrophic toenails, 1 through 5 bilateral and clearing of subungual debris. No ulceration, no infection noted. The nail plate  was removed from the second toe nail bed.  nail bed.  The nail was attached distally and was difficult to remove since the nail was weakly attached at distal nail bed. Her nail bed was cauterized to stop bleeding from distal nail bed.  Neosporin/DSD was applied.  Patient to perform peroxide washes and bandage second toe nail bed until healed.  If problem occurs, call the office. Return Visit-Office Procedure: Patient instructed to return to the office for a follow up visit 3 months for preventative foot care.    Gardiner Barefoot DPM

## 2018-11-24 DIAGNOSIS — M1990 Unspecified osteoarthritis, unspecified site: Secondary | ICD-10-CM

## 2018-11-24 DIAGNOSIS — F39 Unspecified mood [affective] disorder: Secondary | ICD-10-CM | POA: Diagnosis not present

## 2018-11-24 DIAGNOSIS — E43 Unspecified severe protein-calorie malnutrition: Secondary | ICD-10-CM | POA: Diagnosis not present

## 2018-11-24 DIAGNOSIS — G20 Parkinson's disease: Secondary | ICD-10-CM | POA: Diagnosis not present

## 2018-11-24 DIAGNOSIS — G3183 Dementia with Lewy bodies: Secondary | ICD-10-CM

## 2019-02-03 ENCOUNTER — Ambulatory Visit: Payer: Federal, State, Local not specified - PPO | Admitting: Podiatry

## 2019-02-21 DEATH — deceased
